# Patient Record
Sex: Female | Born: 1960
Health system: Southern US, Community
[De-identification: ages and names within clinical notes are randomized; demographics above are authoritative.]

## PROBLEM LIST (undated history)

## (undated) DIAGNOSIS — F419 Anxiety disorder, unspecified: Secondary | ICD-10-CM

## (undated) DIAGNOSIS — G4733 Obstructive sleep apnea (adult) (pediatric): Secondary | ICD-10-CM

## (undated) DIAGNOSIS — M797 Fibromyalgia: Secondary | ICD-10-CM

## (undated) DIAGNOSIS — F329 Major depressive disorder, single episode, unspecified: Secondary | ICD-10-CM

## (undated) DIAGNOSIS — F32A Depression, unspecified: Secondary | ICD-10-CM

## (undated) DIAGNOSIS — K219 Gastro-esophageal reflux disease without esophagitis: Secondary | ICD-10-CM

## (undated) DIAGNOSIS — M1990 Unspecified osteoarthritis, unspecified site: Secondary | ICD-10-CM

## (undated) DIAGNOSIS — N898 Other specified noninflammatory disorders of vagina: Secondary | ICD-10-CM

## (undated) DIAGNOSIS — G4700 Insomnia, unspecified: Secondary | ICD-10-CM

## (undated) HISTORY — PX: ABDOMINAL HYSTERECTOMY: SHX81

## (undated) HISTORY — DX: Insomnia, unspecified: G47.00

## (undated) HISTORY — PX: HEMORRHOID SURGERY: SHX153

## (undated) HISTORY — DX: Fibromyalgia: M79.7

## (undated) HISTORY — DX: Other specified noninflammatory disorders of vagina: N89.8

## (undated) HISTORY — DX: Obstructive sleep apnea (adult) (pediatric): G47.33

## (undated) HISTORY — PX: CHOLECYSTECTOMY: SHX55

---

## 2001-01-23 ENCOUNTER — Encounter: Payer: Self-pay | Admitting: Internal Medicine

## 2001-01-23 ENCOUNTER — Ambulatory Visit (HOSPITAL_COMMUNITY): Admission: RE | Admit: 2001-01-23 | Discharge: 2001-01-23 | Payer: Self-pay | Admitting: Internal Medicine

## 2001-08-23 ENCOUNTER — Encounter: Payer: Self-pay | Admitting: Obstetrics and Gynecology

## 2001-08-23 ENCOUNTER — Ambulatory Visit (HOSPITAL_COMMUNITY): Admission: RE | Admit: 2001-08-23 | Discharge: 2001-08-23 | Payer: Self-pay | Admitting: Obstetrics and Gynecology

## 2001-10-15 ENCOUNTER — Ambulatory Visit (HOSPITAL_COMMUNITY): Admission: RE | Admit: 2001-10-15 | Discharge: 2001-10-15 | Payer: Self-pay | Admitting: General Surgery

## 2002-08-29 ENCOUNTER — Ambulatory Visit (HOSPITAL_COMMUNITY): Admission: RE | Admit: 2002-08-29 | Discharge: 2002-08-29 | Payer: Self-pay | Admitting: Pulmonary Disease

## 2002-09-05 ENCOUNTER — Ambulatory Visit (HOSPITAL_COMMUNITY): Admission: RE | Admit: 2002-09-05 | Discharge: 2002-09-05 | Payer: Self-pay | Admitting: General Surgery

## 2003-10-05 ENCOUNTER — Ambulatory Visit (HOSPITAL_COMMUNITY): Admission: RE | Admit: 2003-10-05 | Discharge: 2003-10-05 | Payer: Self-pay | Admitting: Family Medicine

## 2004-10-28 ENCOUNTER — Ambulatory Visit (HOSPITAL_COMMUNITY): Admission: RE | Admit: 2004-10-28 | Discharge: 2004-10-28 | Payer: Self-pay | Admitting: Obstetrics and Gynecology

## 2004-11-18 ENCOUNTER — Ambulatory Visit (HOSPITAL_COMMUNITY): Admission: RE | Admit: 2004-11-18 | Discharge: 2004-11-18 | Payer: Self-pay | Admitting: Obstetrics & Gynecology

## 2004-11-28 ENCOUNTER — Ambulatory Visit (HOSPITAL_COMMUNITY): Admission: RE | Admit: 2004-11-28 | Discharge: 2004-11-28 | Payer: Self-pay | Admitting: Obstetrics & Gynecology

## 2005-10-29 ENCOUNTER — Ambulatory Visit (HOSPITAL_COMMUNITY): Admission: RE | Admit: 2005-10-29 | Discharge: 2005-10-29 | Payer: Self-pay | Admitting: Obstetrics and Gynecology

## 2006-11-09 ENCOUNTER — Ambulatory Visit (HOSPITAL_COMMUNITY): Admission: RE | Admit: 2006-11-09 | Discharge: 2006-11-09 | Payer: Self-pay | Admitting: Obstetrics and Gynecology

## 2008-01-18 ENCOUNTER — Ambulatory Visit (HOSPITAL_COMMUNITY): Admission: RE | Admit: 2008-01-18 | Discharge: 2008-01-18 | Payer: Self-pay | Admitting: Obstetrics and Gynecology

## 2009-01-23 ENCOUNTER — Ambulatory Visit (HOSPITAL_COMMUNITY): Admission: RE | Admit: 2009-01-23 | Discharge: 2009-01-23 | Payer: Self-pay | Admitting: Pulmonary Disease

## 2009-02-07 ENCOUNTER — Ambulatory Visit (HOSPITAL_COMMUNITY): Admission: RE | Admit: 2009-02-07 | Discharge: 2009-02-07 | Payer: Self-pay | Admitting: Obstetrics and Gynecology

## 2009-09-19 ENCOUNTER — Encounter (INDEPENDENT_AMBULATORY_CARE_PROVIDER_SITE_OTHER): Payer: Self-pay | Admitting: Pulmonary Disease

## 2009-09-19 ENCOUNTER — Ambulatory Visit: Payer: Self-pay | Admitting: Cardiovascular Disease

## 2009-09-19 ENCOUNTER — Ambulatory Visit (HOSPITAL_COMMUNITY): Admission: RE | Admit: 2009-09-19 | Discharge: 2009-09-19 | Payer: Self-pay | Admitting: Pulmonary Disease

## 2010-04-11 ENCOUNTER — Ambulatory Visit (HOSPITAL_COMMUNITY): Admission: RE | Admit: 2010-04-11 | Discharge: 2010-04-11 | Payer: Self-pay | Admitting: Obstetrics & Gynecology

## 2011-02-28 NOTE — H&P (Signed)
   NAME:  Monica Davis, Monica Davis                         ACCOUNT NO.:  [id number]   MEDICAL RECORD NO.:  [id number]                   PATIENT TYPE:   LOCATION:                                       FACILITY:   PHYSICIAN:  Dalia Heading, M.D.               DATE OF BIRTH:   DATE OF ADMISSION:  09/05/2002  DATE OF DISCHARGE:                                HISTORY & PHYSICAL   AGE:  50 years old.   CHIEF COMPLAINT:  Biliary colic secondary to cholelithiasis.   HISTORY OF PRESENT ILLNESS:  The patient is a 50 year old white female who  is referred for evaluation and treatment of biliary colic secondary to  cholelithiasis.  She has been having right upper quadrant abdominal pain,  nausea, bloating, and fatty food intolerance for a few months.  It has been  getting worse of late.  No fever, chills, or jaundice have been noted.   PAST MEDICAL HISTORY:  1. Depression.  2. Muscle strain.   PAST SURGICAL HISTORY:  1. Hysterectomy.  2. Hemorrhoidectomy.  3. Rectocele repair.  4. Bladder tack-up in the past.   CURRENT MEDICATIONS:  Baby aspirin, __________, Flexeril, Wellbutrin, B6 and  12 vitamin supplements.   ALLERGIES:  No known drug allergies.   REVIEW OF SYSTEMS:  The patient denies drinking or smoking.  She denies any  other cardiopulmonary difficulties or bleeding disorders.   PHYSICAL EXAMINATION:  GENERAL:  Well-developed, well-nourished white female  in no acute distress.  VITAL SIGNS:  Afebrile, and vital signs are stable.  HEENT:  No scleral icterus.  LUNGS:  Clear to auscultation with equal breath sounds bilaterally.  HEART:  Regular rate and rhythm.  Without S3, S4, or murmurs.  ABDOMEN:  Soft, with tenderness noted in the right upper quadrant to  palpation.  No hepatosplenomegaly, masses, hernias, or rigidity are noted.   LABORATORY DATA:  Ultrasound of the gallbladder reveals cholelithiasis with  a normal common bile duct.   IMPRESSION:  1. Biliary colic.  2.  Cholelithiasis.   PLAN:  The patient is scheduled for laparoscopic cholecystectomy on September 05, 2002.  The risks and benefits of the procedure including bleeding,  infection, hepatobiliary injury, and the possibility of an open procedure  were fully explained to the patient, who gave informed consent.                                               Dalia Heading, M.D.    MAJ/MEDQ  D:  08/30/2002  T:  08/30/2002  Job:  [id number]   cc:   Ramon Dredge L. Juanetta Gosling, M.D.  [address]  [place]  [place] [address]  Fax: [phone]

## 2011-02-28 NOTE — Op Note (Signed)
[facility]  Patient:    Monica Davis, Monica Davis Visit Number: [id number] MRN: [id number]          Service Type: DSU Location: DAY Attending Physician:  Dalia Heading Dictated by:   Franky Macho, M.D. Proc. Date: 10/15/01 Admit Date:  10/15/2001   CC:         Kari Baars, M.D.  Christin Bach, M.D.   Operative Report  AGE:  50 years old.  PREOPERATIVE DIAGNOSIS:  Hemorrhoidal disease.  POSTOPERATIVE DIAGNOSIS:  Hemorrhoidal disease.  PROCEDURE:  Internal and external hemorrhoidectomy.  SURGEON:  Franky Macho, M.D.  ANESTHESIA:  General.  INDICATIONS:  Patient is a 50 year old white female who presents with symptomatic hemorrhoidal disease.  The risks and benefits of the procedure including bleeding, infection and recurrence of the hemorrhoidal disease were fully explained to the patient, who gave informed consent.  DESCRIPTION OF PROCEDURE:  Patient was placed in lithotomy position after general anesthesia was administered.  The perineum was prepped and draped using the usual sterile technique with Betadine.  On examination, the patient had internal and external hemorrhoidal tissue at the 6 oclock position with an external hemorrhoidal skin tag anteriorly.  She is symptomatic from the posterior portion of the anus and thus, an internal and external hemorrhoidectomy was performed at the 6 oclock position.  A 2-0 Vicryl suture was placed at the apex of the internal hemorrhoid at the dentate line.  The hemorrhoidal tissue was then excised using Bovie electrocautery without difficulty.  The mucosal edges were reapproximated using a running 2-0 Vicryl suture.  Sensorcaine 0.5% was instilled into the surrounding perineum and the rectum was packed with Surgicel and viscous Xylocaine.  All tape and needle counts were correct at the end of the procedure.  The patient was awakened and transferred to PACU in stable  condition.  COMPLICATIONS:  None.  SPECIMEN:  Internal and external hemorrhoid.  BLOOD LOSS:  Minimal. Dictated by:   Franky Macho, M.D. Attending Physician:  Dalia Heading DD:  10/15/01 TD:  10/15/01 Job: [id number]

## 2011-02-28 NOTE — Op Note (Signed)
NAME:  Monica Davis, Monica Davis                         ACCOUNT NO.:  [id number]   MEDICAL RECORD NO.:  [id number]                  PATIENT TYPE:   LOCATION:                                       FACILITY:   PHYSICIAN:  Dalia Heading, M.D.               DATE OF BIRTH:   DATE OF PROCEDURE:  09/05/2002  DATE OF DISCHARGE:                                 OPERATIVE REPORT   PREOPERATIVE DIAGNOSIS:  Biliary colic, cholelithiasis.   POSTOPERATIVE DIAGNOSIS:  Biliary colic, cholelithiasis.   PROCEDURE:  Laparoscopic cholecystectomy   SURGEON:  Dalia Heading, M.D.   ANESTHESIA:  General endotracheal   INDICATIONS:  The patient is a 50 year old white female who is referred for  evaluation and treatment of biliary colic secondary to cholelithiasis.  The  risks and benefits of the procedure including bleeding, infection,  hepatobiliary injury, and the possibility of an open procedure were fully  explained to the patient, who gave informed consent.   DESCRIPTION OF PROCEDURE:  The patient was placed in the supine position.  After induction of general endotracheal anesthesia, the abdomen was prepped  and draped using the usual sterile technique with Betadine.  Surgical site  confirmation was performed.   A supraumbilical incision was made down to the fascia.  A Veress needle was  introduced into the abdominal cavity and confirmation of placement was done  using the saline drop test.  The abdomen was then insufflated to 16 mmHg  pressure.  An 11-mm trocar was introduced into the abdominal cavity under  direct visualization without difficulty.  The patient was placed in reverse  Trendelenburg position and an additional 11-mm trocar was placed in the  epigastric region and 5-mm trocars were placed in the right upper quadrant  and right flank regions. The liver was inspected and noted to be within  normal limits.   The gallbladder was retracted superiorly and laterally.  The dissection was  begun around the infundibulum of the gallbladder.  The cystic duct was first  identified.  Its junction to the infundibulum fully identified.  Endoclips  were placed proximally and distally on the cystic duct; and the cystic duct  was divided.  This was likewise done on the cystic artery.  The gallbladder  was then freed away from the gallbladder fossa using Bovie electrocautery.  The gallbladder was delivered through the epigastric trocar site using an  EndoCatch bag.  The gallbladder fossa was inspected and no abnormal bleeding  or bile leakage was noted.  Surgicel was placed in the gallbladder fossa,  the subhepatic space, as well as the right hepatic gutter irrigated with  normal saline.  All fluid and air were then evacuated from the abdominal  cavity prior to removal of the trocars.   All wounds were irrigated with normal saline.  All wounds were injected with  0.5% Sensorcaine.  The supraumbilical fascia was reapproximated using an #0  Vicryl  interrupted suture. All skin incisions were closed using staples.  Betadine ointment and dry sterile dressings were applied.   All tape and needle counts correct at the end of the procedure.  The patient  was extubated in the operating room and went back to recovery room in awake  and stable condition.   COMPLICATIONS:  None.   SPECIMENS:  Gallbladder with stones.   BLOOD LOSS:  Minimal.                                               Dalia Heading, M.D.    MAJ/MEDQ  D:  09/05/2002  T:  09/05/2002  Job:  [id number]   cc:   Ramon Dredge L. Juanetta Gosling, M.D.  [address]  [place]  [place] [address]  Fax: [phone]

## 2011-02-28 NOTE — H&P (Signed)
[facility]  Patient:    Monica Davis, FLOOR Visit Number: [id number] MRN: [id number]          Service Type: OUT Location: RAD Attending Physician:  Tilda Burrow Dictated by:   Franky Macho, M.D. Admit Date:  08/23/2001 Discharge Date: 08/23/2001   CC:         Kari Baars, M.D.             Christin Bach, M.D.                         History and Physical  DATE OF BIRTH:  Dec 06, 1960  CHIEF COMPLAINT:  Hemorrhoidal disease.  HISTORY OF PRESENT ILLNESS:  The patient is a 50 year old white female whose referred for evaluation and treatment of hemorrhoidal disease. She occasionally gets perianal irritation and spots of blood on the toilet paper when she wipes herself. She does have some problems with complete evacuation of her rectum after a bowel movement. She has had a rectocele repaired in the past. No abdominal pain, constipation, diarrhea, melena, or family history of colon carcinoma is noted.  PAST MEDICAL HISTORY:  Depression.  PAST SURGICAL HISTORY:  Total abdominal hysterectomy, rectocele repair, bladder tack in the past.  CURRENT MEDICATIONS:  Paxil, hormone pills, baby aspirin, vitamins.  ALLERGIES:  No known drug allergies.  REVIEW OF SYSTEMS:  The patient denies drinking or smoking. She denies any other cardiopulmonary difficulties or bleeding disorders.  PHYSICAL EXAMINATION:  GENERAL:  The patient is a well-developed, well-nourished, white female in no acute distress.  VITAL SIGNS:  She is afebrile and vital signs are stable.  LUNGS:  Clear to auscultation with equal breath sounds bilaterally.  HEART:  Reveals a regular rate and rhythm without S3, S4, or murmurs.  ABDOMEN:  Unremarkable.  RECTAL:  Reveals external hemorrhoidal skin tag noted posteriorly. No internal hemorrhoids are noted. Good sphincter tone is noted and the stool is Hemoccult negative.  IMPRESSION:  External hemorrhoidal disease.  PLAN:  The  patient is scheduled for an external hemorrhoidectomy on October 15, 2001. The risks and benefits of the procedure including bleeding, infection, and incontinence were fully explained to the patient, who gave informed consent. Dictated by:   Franky Macho, M.D. Attending Physician:  Tilda Burrow DD:  10/12/01 TD:  10/12/01 Job: [id number]

## 2011-03-04 ENCOUNTER — Other Ambulatory Visit: Payer: Self-pay | Admitting: Obstetrics & Gynecology

## 2011-03-04 DIAGNOSIS — Z139 Encounter for screening, unspecified: Secondary | ICD-10-CM

## 2011-04-21 ENCOUNTER — Ambulatory Visit (HOSPITAL_COMMUNITY)
Admission: RE | Admit: 2011-04-21 | Discharge: 2011-04-21 | Disposition: A | Discharge status: Home / Self Care | Payer: BC Managed Care – PPO | Source: Ambulatory Visit | Attending: Obstetrics & Gynecology | Admitting: Obstetrics & Gynecology

## 2011-04-21 DIAGNOSIS — Z139 Encounter for screening, unspecified: Secondary | ICD-10-CM

## 2011-04-21 DIAGNOSIS — Z1231 Encounter for screening mammogram for malignant neoplasm of breast: Secondary | ICD-10-CM | POA: Insufficient documentation

## 2011-04-24 ENCOUNTER — Other Ambulatory Visit: Payer: Self-pay | Admitting: Obstetrics & Gynecology

## 2011-04-24 DIAGNOSIS — R928 Other abnormal and inconclusive findings on diagnostic imaging of breast: Secondary | ICD-10-CM

## 2011-04-25 ENCOUNTER — Ambulatory Visit
Admission: RE | Admit: 2011-04-25 | Discharge: 2011-04-25 | Disposition: A | Discharge status: Home / Self Care | Payer: BC Managed Care – PPO | Source: Ambulatory Visit | Attending: Obstetrics & Gynecology | Admitting: Obstetrics & Gynecology

## 2011-04-25 DIAGNOSIS — R928 Other abnormal and inconclusive findings on diagnostic imaging of breast: Secondary | ICD-10-CM

## 2011-12-31 ENCOUNTER — Other Ambulatory Visit: Payer: Self-pay | Admitting: Adult Health

## 2011-12-31 DIAGNOSIS — N63 Unspecified lump in unspecified breast: Secondary | ICD-10-CM

## 2012-01-14 ENCOUNTER — Inpatient Hospital Stay (HOSPITAL_COMMUNITY)
Admission: RE | Admit: 2012-01-14 | Discharge: 2012-01-14 | Payer: BC Managed Care – PPO | Source: Ambulatory Visit | Attending: Adult Health | Admitting: Adult Health

## 2012-01-28 ENCOUNTER — Ambulatory Visit (HOSPITAL_COMMUNITY)
Admission: RE | Admit: 2012-01-28 | Discharge: 2012-01-28 | Disposition: A | Discharge status: Home / Self Care | Payer: BC Managed Care – PPO | Source: Ambulatory Visit | Attending: Adult Health | Admitting: Adult Health

## 2012-01-28 ENCOUNTER — Other Ambulatory Visit: Payer: Self-pay | Admitting: Adult Health

## 2012-01-28 DIAGNOSIS — N63 Unspecified lump in unspecified breast: Secondary | ICD-10-CM

## 2013-03-15 ENCOUNTER — Other Ambulatory Visit: Payer: Self-pay | Admitting: Adult Health

## 2013-04-05 ENCOUNTER — Telehealth: Payer: Self-pay | Admitting: Adult Health

## 2013-04-05 NOTE — Telephone Encounter (Signed)
Left message I called and to call back if needed

## 2013-04-05 NOTE — Telephone Encounter (Signed)
Pt called back she is at the beach til 04[place]18[place]13 and has irritation under panniculus that the mytex and diflucan has not helped as much a usual.Told her to try corn starch to see if that helps with moisture, if not call back.

## 2013-04-06 ENCOUNTER — Telehealth: Payer: Self-pay | Admitting: Adult Health

## 2013-04-06 NOTE — Telephone Encounter (Signed)
Left message to call back  

## 2013-05-02 ENCOUNTER — Other Ambulatory Visit: Payer: Self-pay | Admitting: Adult Health

## 2014-04-17 ENCOUNTER — Other Ambulatory Visit: Payer: Self-pay | Admitting: Adult Health

## 2014-04-17 DIAGNOSIS — Z1231 Encounter for screening mammogram for malignant neoplasm of breast: Secondary | ICD-10-CM

## 2014-04-26 ENCOUNTER — Ambulatory Visit (HOSPITAL_COMMUNITY)
Admission: RE | Admit: 2014-04-26 | Discharge: 2014-04-26 | Disposition: A | Discharge status: Home / Self Care | Payer: BC Managed Care – PPO | Source: Ambulatory Visit | Attending: Adult Health | Admitting: Adult Health

## 2014-04-26 DIAGNOSIS — Z1231 Encounter for screening mammogram for malignant neoplasm of breast: Secondary | ICD-10-CM | POA: Insufficient documentation

## 2014-05-01 ENCOUNTER — Other Ambulatory Visit: Payer: Self-pay | Admitting: Adult Health

## 2014-05-22 ENCOUNTER — Ambulatory Visit (INDEPENDENT_AMBULATORY_CARE_PROVIDER_SITE_OTHER): Payer: BC Managed Care – PPO | Admitting: Adult Health

## 2014-05-22 ENCOUNTER — Encounter: Payer: Self-pay | Admitting: Adult Health

## 2014-05-22 VITALS — BP 120/60 | HR 76 | Ht 64.0 in | Wt 227.5 lb

## 2014-05-22 DIAGNOSIS — Z01419 Encounter for gynecological examination (general) (routine) without abnormal findings: Secondary | ICD-10-CM

## 2014-05-22 DIAGNOSIS — N898 Other specified noninflammatory disorders of vagina: Secondary | ICD-10-CM

## 2014-05-22 DIAGNOSIS — M2550 Pain in unspecified joint: Secondary | ICD-10-CM

## 2014-05-22 DIAGNOSIS — Z1212 Encounter for screening for malignant neoplasm of rectum: Secondary | ICD-10-CM

## 2014-05-22 HISTORY — DX: Other specified noninflammatory disorders of vagina: N89.8

## 2014-05-22 LAB — HEMOCCULT GUIAC POC 1CARD (OFFICE): Fecal Occult Blood, POC: NEGATIVE

## 2014-05-22 MED ORDER — NYSTATIN-TRIAMCINOLONE 100000-0.1 UNIT/GM-% EX CREA: TOPICAL_CREAM | CUTANEOUS | Status: DC

## 2014-05-22 NOTE — Progress Notes (Signed)
Patient ID: Monica Davis, female   DOB: [birth year], 53 y.o.   MRN: [id number] History of Present Illness: Monica Davis is a 53 year old white female, married, in for a physical, she is sp hysterectomy.She has some vaginal dryness and pain in her joints, she has fibromyalgia.Has place at beach and is expecting 3rd grand child.   Current Medications, Allergies, Past Medical History, Past Surgical History, Family History and Social History were reviewed in [employer] record.     Review of Systems: Patient denies any headaches, blurred vision, shortness of breath, chest pain, abdominal pain, problems with bowel movements, urination, or intercourse. No mood swings, see positives in HPI.    Physical Exam:BP 120/60  Pulse 76  Ht 5\' 4"  (1.626 m)  Wt 227 lb 8 oz (103.193 kg)  BMI 39.03 kg/m2 General:  Well developed, well nourished, no acute distress Skin:  Warm and dry Neck:  Midline trachea, normal thyroid Lungs; Clear to auscultation bilaterally Breast:  No dominant palpable mass, retraction, or nipple discharge Cardiovascular: Regular rate and rhythm Abdomen:  Soft, non tender, no hepatosplenomegaly Pelvic:  External genitalia is normal in appearance.  The vagina is pale with loss of moisture and rugae.The cervix and uterus are absent.  No  adnexal masses or tenderness noted. Rectal: Good sphincter tone, no polyps, or hemorrhoids felt.  Hemoccult negative. Extremities:  No swelling or varicosities noted, has some changes in DIP and PIP joints Psych:  No mood changes, alert and cooperative,seems happy   Impression: Yearly exam no pap Vaginal dryness Joint pain    Plan: Physical in 1 year Mammogram yearly Colonoscopy advised Check CBC,CMP,TSH and lipids and ANA,RF Refilled mycolog cream Try Samul Dada

## 2014-05-22 NOTE — Patient Instructions (Signed)
Physical in 1 year Mammogram yearly  Colonoscopy advised  

## 2014-05-23 ENCOUNTER — Telehealth: Payer: Self-pay | Admitting: Adult Health

## 2014-05-23 LAB — CBC
HCT: 43.9 % (ref 36.0–46.0)
Hemoglobin: 14.7 g/dL (ref 12.0–15.0)
MCH: 29.9 pg (ref 26.0–34.0)
MCHC: 33.5 g/dL (ref 30.0–36.0)
MCV: 89.2 fL (ref 78.0–100.0)
Platelets: 259 10*3/uL (ref 150–400)
RBC: 4.92 MIL/uL (ref 3.87–5.11)
RDW: 13.9 % (ref 11.5–15.5)
WBC: 7.4 10*3/uL (ref 4.0–10.5)

## 2014-05-23 LAB — LIPID PANEL
Cholesterol: 262 mg/dL — ABNORMAL HIGH (ref 0–200)
HDL: 55 mg/dL (ref >39)
LDL Cholesterol: 164 mg/dL — ABNORMAL HIGH (ref 0–99)
Total CHOL/HDL Ratio: 4.8 Ratio
Triglycerides: 216 mg/dL — ABNORMAL HIGH (ref <150)
VLDL: 43 mg/dL — ABNORMAL HIGH (ref 0–40)

## 2014-05-23 LAB — COMPREHENSIVE METABOLIC PANEL
ALT: 21 U/L (ref 0–35)
AST: 18 U/L (ref 0–37)
Albumin: 4.6 g/dL (ref 3.5–5.2)
Alkaline Phosphatase: 60 U/L (ref 39–117)
BUN: 14 mg/dL (ref 6–23)
CO2: 29 mEq/L (ref 19–32)
Calcium: 9.9 mg/dL (ref 8.4–10.5)
Chloride: 102 mEq/L (ref 96–112)
Creat: 0.64 mg/dL (ref 0.50–1.10)
Glucose, Bld: 89 mg/dL (ref 70–99)
Potassium: 4.6 mEq/L (ref 3.5–5.3)
Sodium: 141 mEq/L (ref 135–145)
Total Bilirubin: 0.5 mg/dL (ref 0.2–1.2)
Total Protein: 6.8 g/dL (ref 6.0–8.3)

## 2014-05-23 LAB — ANA: Anti Nuclear Antibody(ANA): NEGATIVE

## 2014-05-23 LAB — RHEUMATOID FACTOR: Rhuematoid fact SerPl-aCnc: <10 IU/mL (ref <=14)

## 2014-05-23 LAB — TSH: TSH: 1.43 u[IU]/mL (ref 0.350–4.500)

## 2014-05-23 NOTE — Telephone Encounter (Signed)
Pt aware of labs, will mail her a copy

## 2014-05-31 ENCOUNTER — Telehealth: Payer: Self-pay | Admitting: Adult Health

## 2014-05-31 NOTE — Telephone Encounter (Signed)
Wants be to call about daughter

## 2014-08-14 ENCOUNTER — Encounter: Payer: Self-pay | Admitting: Adult Health

## 2014-10-04 ENCOUNTER — Telehealth: Payer: Self-pay | Admitting: *Deleted

## 2014-10-04 NOTE — Telephone Encounter (Signed)
Monica Davis has some varicose veins and one on the side of her calf burst Saturday after she scratched it and then bled again yesterday, does not look infected or red, no pain, appt offered she declined, try covering with light pressure dressing and make appt with vein center, for evaluation may need laser at some point.Her PCP is out of the office.

## 2014-11-03 ENCOUNTER — Ambulatory Visit (HOSPITAL_COMMUNITY): Payer: BC Managed Care – PPO | Admitting: Physical Therapy

## 2014-11-15 ENCOUNTER — Ambulatory Visit (HOSPITAL_COMMUNITY)
Admission: RE | Admit: 2014-11-15 | Discharge: 2014-11-15 | Disposition: A | Discharge status: Home / Self Care | Payer: BC Managed Care – PPO | Source: Ambulatory Visit | Attending: Internal Medicine | Admitting: Internal Medicine

## 2014-11-15 DIAGNOSIS — M545 Low back pain, unspecified: Secondary | ICD-10-CM

## 2014-11-15 DIAGNOSIS — M25659 Stiffness of unspecified hip, not elsewhere classified: Secondary | ICD-10-CM | POA: Insufficient documentation

## 2014-11-15 DIAGNOSIS — R262 Difficulty in walking, not elsewhere classified: Secondary | ICD-10-CM | POA: Diagnosis present

## 2014-11-15 DIAGNOSIS — M546 Pain in thoracic spine: Secondary | ICD-10-CM | POA: Diagnosis not present

## 2014-11-15 DIAGNOSIS — M25519 Pain in unspecified shoulder: Secondary | ICD-10-CM

## 2014-11-15 DIAGNOSIS — M2560 Stiffness of unspecified joint, not elsewhere classified: Secondary | ICD-10-CM

## 2014-11-15 DIAGNOSIS — M25562 Pain in left knee: Secondary | ICD-10-CM | POA: Insufficient documentation

## 2014-11-15 DIAGNOSIS — M25619 Stiffness of unspecified shoulder, not elsewhere classified: Secondary | ICD-10-CM | POA: Insufficient documentation

## 2014-11-15 NOTE — Therapy (Signed)
[place], [place], [address] Phone: [phone]   Fax:  [phone]  Physical Therapy Treatment  Patient Details  Name: Monica Davis MRN: [id number] Date of Birth: [birth year] Referring Provider:  Delphina Cahill, MD  Encounter Date: 11/15/2014      PT End of Session - 11/15/14 1644    Visit Number 1   Number of Visits 16   Date for PT Re-Evaluation 12/15/14   Authorization Type [employer]   Authorization - Visit Number 1   Authorization - Number of Visits 16   PT Start Time 6962   PT Stop Time 1611   PT Time Calculation (min) 50 min   Activity Tolerance Patient tolerated treatment well   Behavior During Therapy [facility] for tasks assessed/performed      Past Medical History  Diagnosis Date  . Fibromyalgia   . Vaginal dryness 05/22/2014    Past Surgical History  Procedure Laterality Date  . Abdominal hysterectomy    . Cholecystectomy    . Hemorrhoid surgery      There were no vitals taken for this visit.  Visit Diagnosis:  Difficulty walking  Stiffness of hip joint, unspecified laterality  Joint stiffness of spine  Shoulder stiffness, unspecified laterality  Knee pain, acute, left  Low back pain at multiple sites  Pain in joint, shoulder region, unspecified laterality      Subjective Assessment - 11/15/14 1528    Symptoms hip, back and shoulder pain.    Pertinent History Patient has a long history of pain secondary to fibromyalgia, and pain that displaysed minimalimprovement with medication. Diagnosed with fibromyalgia since 2000. Difficuly walking secondary to pain. patient is wearing compression stockings that she beleievs have helped cut down the swelling in her legs s/p vein blowing out.    Limitations Sitting;Standing   How long can you sit comfortably? <10 minutes   How long can you walk comfortably? < 1 hour before back pain.    Patient Stated Goals decrease pain, be able to walk for 1 hour  comfortably, patient wants to be able to mow the lawn.    Currently in Pain? Yes   Pain Score 5    Pain Location Back  Back, and knees most today.   Pain Orientation Posterior   Pain Descriptors / Indicators Tender;Aching;Tingling  tingling in arms   Pain Type Chronic pain   Pain Onset More than a month ago   Aggravating Factors  not chanign positions, and walking for prolonged periiods of time.    Pain Relieving Factors changing positions. flexiril and hydrocodone.           [facility] PT Assessment - 11/15/14 0001    Assessment   Medical Diagnosis  shoulder pain hip pain, knee pain, low back pain, stiffness in thoracic spine, hip stiffness, shoulder stiffness.  dificulty walking.    Onset Date 10/13/14   Next MD Visit Delphina Cahill, 12/09/14   Prior Therapy no   Balance Screen   Has the patient fallen in the past 6 months Yes   How many times? multiple times   Has the patient had a decrease in activity level because of a fear of falling?  No   Is the patient reluctant to leave their home because of a fear of falling?  No   Prior Function   Level of Independence Independent with basic ADLs   Vocation Full time employment   Observation/Other Assessments   Focus on Therapeutic Outcomes (FOTO)  44% limited   Other:   Other/ Comments Gait: limited weight shift to right, limited calcaneal version, limited hip internal rotation,  limited hip sway,    Other:   Other/Comments 3D hip excursion: limited squatability secondary to knee pain.    AROM   Right Shoulder Flexion 145 Degrees   Right Shoulder Horizontal ABduction 0 Degrees   Left Shoulder Flexion 145 Degrees   Left Shoulder Horizontal ABduction 0 Degrees   Right Hip Internal Rotation  30   Left Hip Internal Rotation  19   Right Ankle Dorsiflexion 20   Left Ankle Dorsiflexion 13   Cervical Flexion WFL   Cervical Extension WFL   Cervical - Right Rotation WFL   Cervical - Left Rotation WFRL   Lumbar Flexion WFL   Lumbar Extension  WFL   Lumbar - Right Rotation 28   Lumbar - Left Rotation 44   Strength   Right Hip Extension 3/5   Right Hip ABduction 3/5   Left Hip Extension 3/5   Left Hip ABduction 3/5   Flexibility   Soft Tissue Assessment /Muscle Lenght yes   Hamstrings WNL   Quadriceps Positive 120, 110 on Lt.  bilaterally   Piriformis limited bilateral, more limited on left           [facility] Adult PT Treatment/Exercise - 11/15/14 0001    Posture/Postural Control   Posture/Postural Control Postural limitations   Posture Comments forward rounded shoudlers, excessive kyphosis   Lumbar Exercises: Seated   Other Seated Lumbar Exercises 3D thoracic spine excursions 10x with overhead reaches   Knee/Hip Exercises: Standing   Other Standing Knee Exercises 3D hip excursions 10x, no sagittal plane due to pain          PT Education - 11/15/14 1631    Education provided Yes   Education Details Diagnosis and prognosis of fibromyalgia, treatment goals/strategies, and hEP: 3Dhip excursions, 3D thoracic spine excursions.    Person(s) Educated Patient   Methods Explanation;Demonstration;Handout   Comprehension Verbalized understanding;Returned demonstration          PT Short Term Goals - 11/15/14 1635    PT SHORT TERM GOAL #1   Title Patient will dmeosntrate increased bilateral flexion to 155 degrees and horizontal abduction to 15 degrees to more easily reach over head and behind herself.   Baseline Flexion: 145 bilateral, Abduction 0 ilateral   Time 4   Period Weeks   Status New   PT SHORT TERM GOAL #2   Title Patient will demosntrate increased hip internal rotation of 30 degrees bilaterally to improve deceleration mechanics and stride length during gait   Baseline RT: 30, Lt 19   Time 4   Period Weeks   Status New   PT SHORT TERM GOAL #3   Title Patient will demosntrate increased thoracic spine rotation bilaterally to 45 degrees to imrpove ability to look over shoulder while driving   Baseline 28  degrees to Rt, 45 degrees to Lt    Time 4   Period Weeks   Status New   PT SHORT TERM GOAL #4   Title Patient will displays independence with HEP   Time 4   Period Weeks   Status New           PT Long Term Goals - 11/15/14 1638    PT LONG TERM GOAL #1   Title Patient will demosntrate increased bilateral flexion to 165 degrees and horizontal abduction to 25 degrees to more easily reach over head and  behind herself.   Time 8   Period Weeks   Status New   PT LONG TERM GOAL #2   Title Patient will demosntrate increased hip internal rotation of 38 degrees bilaterally to improve deceleration mechanics and stride length during gait   Time 8   Period Weeks   Status New   PT LONG TERM GOAL #3   Title Patient will demosntrate increased thoracic spine rotation bilaterally to 60 degrees to imrpove ability to look over shoulder while driving   Time 8   Period Weeks   Status New   PT LONG TERM GOAL #4   Title Patient will dmoenstrate increased glit max and med strength of 4/5 to improve hip stability during gait and decrease excessive knee loading durign sit to and from standing.    Time 8   Period Weeks   Status New   PT LONG TERM GOAL #5   Title Patient will be able to walk and stand >1 hour with pain <5/10 to perform ADLs and IADLs   Time 8   Period Weeks   Status New           Plan - 11/15/14 1644    Clinical Impression Statement Patient displays stiffness throughtou her hips, thoracic spine, shoulder and knee resulting in difficulty walking secondary to a history of pain related to fibromyalgia. Patient will benefit from skilled physical therapy to improve knee, hip, trunk and shoulder flexibility to improve gait mechanicns, posture and strength so patient can better tolerate full work days, working in her garden , and cleaning her home wiith improved body mechanics and decreased pain    Pt will benefit from skilled therapeutic intervention in order to improve on the following  deficits Abnormal gait;Decreased endurance;Increased muscle spasms;Improper body mechanics;Impaired flexibility;Decreased strength;Difficulty walking;Decreased mobility;Pain;Decreased range of motion;Increased fascial restricitons;Decreased balance   Rehab Potential Good   PT Frequency 2x / week   PT Duration 8 weeks   PT Treatment/Interventions Gait training;Stair training;Patient/family education;Passive range of motion;Functional mobility training;Therapeutic activities;Manual techniques;Therapeutic exercise;Balance training   PT Next Visit Plan Introduction of LE stretches: 3 way hip flexor, 3 way piriformis, 2 way groin, and 3 way hamstring;  way pectoral, latissimus, posterior shoulder and anterior shoulder stretches. begin sumo walk per pain tolerance   PT Home Exercise Plan 3D thoracic spine excursions, 3 D hip excursions no sagittal plane.    Consulted and Agree with Plan of Care Patient      Problem List Patient Active Problem List   Diagnosis Date Noted  . Vaginal dryness 05/22/2014    Devona Konig PT DPT [place], [place], [address] Phone: [phone]   Fax:  [phone]

## 2014-11-20 ENCOUNTER — Ambulatory Visit (HOSPITAL_COMMUNITY): Payer: BC Managed Care – PPO | Admitting: Physical Therapy

## 2014-11-23 ENCOUNTER — Encounter (HOSPITAL_COMMUNITY): Payer: BC Managed Care – PPO | Admitting: Physical Therapy

## 2014-11-28 ENCOUNTER — Encounter (HOSPITAL_COMMUNITY): Payer: BC Managed Care – PPO

## 2014-11-28 NOTE — Addendum Note (Signed)
Encounter addended by: Leia Alf, PT on: 11/28/2014 10:07 AM<BR>     Documentation filed: Orders

## 2014-11-30 ENCOUNTER — Encounter (HOSPITAL_COMMUNITY): Payer: BC Managed Care – PPO | Admitting: Physical Therapy

## 2014-12-04 ENCOUNTER — Encounter (HOSPITAL_COMMUNITY): Payer: BC Managed Care – PPO | Admitting: Physical Therapy

## 2014-12-11 ENCOUNTER — Encounter (HOSPITAL_COMMUNITY): Payer: BC Managed Care – PPO | Admitting: Physical Therapy

## 2014-12-18 ENCOUNTER — Encounter (HOSPITAL_COMMUNITY): Payer: BC Managed Care – PPO | Admitting: Physical Therapy

## 2014-12-21 ENCOUNTER — Encounter (HOSPITAL_COMMUNITY): Payer: Self-pay | Admitting: Physical Therapy

## 2014-12-21 NOTE — Therapy (Signed)
[place], [place], [address] Phone: [phone]   Fax:  [phone]  Patient Details  Name: Monica Davis MRN: [id number] Date of Birth: [birth year] Referring Provider:  No ref. provider found  Encounter Date: 12/21/2014  PHYSICAL THERAPY DISCHARGE SUMMARY  Visits from Start of Care: 1  Current functional level related to goals / functional outcomes: PT Short Term Goals - 11/15/14 1635    PT SHORT TERM GOAL #1   Title Patient will dmeosntrate increased bilateral flexion to 155 degrees and horizontal abduction to 15 degrees to more easily reach over head and behind herself.   Baseline Flexion: 145 bilateral, Abduction 0 ilateral   Time 4   Period Weeks   Status New   PT SHORT TERM GOAL #2   Title Patient will demosntrate increased hip internal rotation of 30 degrees bilaterally to improve deceleration mechanics and stride length during gait   Baseline RT: 30, Lt 19   Time 4   Period Weeks   Status New   PT SHORT TERM GOAL #3   Title Patient will demosntrate increased thoracic spine rotation bilaterally to 45 degrees to imrpove ability to look over shoulder while driving   Baseline 28 degrees to Rt, 45 degrees to Lt    Time 4   Period Weeks   Status New   PT SHORT TERM GOAL #4   Title Patient will displays independence with HEP   Time 4   Period Weeks   Status New           PT Long Term Goals - 11/15/14 1638    PT LONG TERM GOAL #1   Title Patient will demosntrate increased bilateral flexion to 165 degrees and horizontal abduction to 25 degrees to more easily reach over head and behind herself.   Time 8   Period Weeks   Status New   PT LONG TERM GOAL #2   Title Patient will demosntrate increased hip internal rotation of 38 degrees bilaterally to improve deceleration mechanics and stride length during gait    Time 8   Period Weeks   Status New   PT LONG TERM GOAL #3   Title Patient will demosntrate increased thoracic spine rotation bilaterally to 60 degrees to imrpove ability to look over shoulder while driving   Time 8   Period Weeks   Status New   PT LONG TERM GOAL #4   Title Patient will dmoenstrate increased glit max and med strength of 4/5 to improve hip stability during gait and decrease excessive knee loading durign sit to and from standing.    Time 8   Period Weeks   Status New   PT LONG TERM GOAL #5   Title Patient will be able to walk and stand >1 hour with pain <5/10 to perform ADLs and IADLs   Time 8   Period Weeks   Status New          Plan: Patient agrees to discharge.  Patient goals were not met. Patient is being discharged due to financial reasons.  ?????       Devona Konig PT DPT [place] [address] [place], [place], [address] Phone: [phone]   Fax:  [phone]

## 2014-12-25 ENCOUNTER — Encounter (HOSPITAL_COMMUNITY): Payer: BC Managed Care – PPO | Admitting: Physical Therapy

## 2015-01-01 ENCOUNTER — Encounter (HOSPITAL_COMMUNITY): Payer: BC Managed Care – PPO | Admitting: Physical Therapy

## 2015-01-08 ENCOUNTER — Encounter (HOSPITAL_COMMUNITY): Payer: BC Managed Care – PPO | Admitting: Physical Therapy

## 2015-01-15 ENCOUNTER — Encounter (HOSPITAL_COMMUNITY): Payer: BC Managed Care – PPO | Admitting: Physical Therapy

## 2015-01-23 ENCOUNTER — Encounter (INDEPENDENT_AMBULATORY_CARE_PROVIDER_SITE_OTHER): Payer: Self-pay | Admitting: *Deleted

## 2015-02-01 ENCOUNTER — Other Ambulatory Visit (INDEPENDENT_AMBULATORY_CARE_PROVIDER_SITE_OTHER): Payer: Self-pay | Admitting: *Deleted

## 2015-02-01 ENCOUNTER — Encounter (INDEPENDENT_AMBULATORY_CARE_PROVIDER_SITE_OTHER): Payer: Self-pay | Admitting: *Deleted

## 2015-02-01 DIAGNOSIS — Z1211 Encounter for screening for malignant neoplasm of colon: Secondary | ICD-10-CM

## 2015-03-15 ENCOUNTER — Telehealth (INDEPENDENT_AMBULATORY_CARE_PROVIDER_SITE_OTHER): Payer: Self-pay | Admitting: *Deleted

## 2015-03-15 NOTE — Telephone Encounter (Signed)
Patient needs trilyte 

## 2015-03-16 MED ORDER — PEG 3350-KCL-NA BICARB-NACL 420 G PO SOLR: 4000.0000 mL | Freq: Once | ORAL | Status: DC

## 2015-03-20 ENCOUNTER — Other Ambulatory Visit: Payer: Self-pay | Admitting: Obstetrics and Gynecology

## 2015-03-20 DIAGNOSIS — Z1231 Encounter for screening mammogram for malignant neoplasm of breast: Secondary | ICD-10-CM

## 2015-04-05 ENCOUNTER — Telehealth (INDEPENDENT_AMBULATORY_CARE_PROVIDER_SITE_OTHER): Payer: Self-pay | Admitting: *Deleted

## 2015-04-05 NOTE — Telephone Encounter (Signed)
Referring MD/PCP: hall   Procedure: tcs  Reason/Indication:  screening  Has patient had this procedure before?  no  If so, when, by whom and where?    Is there a family history of colon cancer?  no  Who?  What age when diagnosed?    Is patient diabetic?   no      Does patient have prosthetic heart valve?  no  Do you have a pacemaker?  no  Has patient ever had endocarditis? no  Has patient had joint replacement within last 12 months?  no  Does patient tend to be constipated or take laxatives? sometime  Is patient on Coumadin, Plavix and/or Aspirin? yes  Medications: asa 81 mg daily, cymbalta 60 mg daily, centrum silver daily, omeprazole otc daily, stool softener prn  Allergies: nkda  Medication Adjustment: asa 2 days  Procedure date & time: 05/03/15 at 830

## 2015-04-05 NOTE — Telephone Encounter (Signed)
agree

## 2015-04-30 ENCOUNTER — Ambulatory Visit (HOSPITAL_COMMUNITY)
Admission: RE | Admit: 2015-04-30 | Discharge: 2015-04-30 | Disposition: A | Discharge status: Home / Self Care | Payer: BC Managed Care – PPO | Source: Ambulatory Visit | Attending: Obstetrics and Gynecology | Admitting: Obstetrics and Gynecology

## 2015-04-30 DIAGNOSIS — Z1231 Encounter for screening mammogram for malignant neoplasm of breast: Secondary | ICD-10-CM | POA: Diagnosis present

## 2015-05-03 ENCOUNTER — Ambulatory Visit (HOSPITAL_COMMUNITY)
Admission: RE | Admit: 2015-05-03 | Discharge: 2015-05-03 | Disposition: A | Discharge status: Home / Self Care | Payer: BC Managed Care – PPO | Source: Ambulatory Visit | Attending: Internal Medicine | Admitting: Internal Medicine

## 2015-05-03 ENCOUNTER — Encounter (HOSPITAL_COMMUNITY)
Admission: RE | Disposition: A | Discharge status: Home / Self Care | Payer: Self-pay | Source: Ambulatory Visit | Attending: Internal Medicine

## 2015-05-03 ENCOUNTER — Encounter (HOSPITAL_COMMUNITY): Payer: Self-pay | Admitting: *Deleted

## 2015-05-03 DIAGNOSIS — K219 Gastro-esophageal reflux disease without esophagitis: Secondary | ICD-10-CM | POA: Insufficient documentation

## 2015-05-03 DIAGNOSIS — Z9049 Acquired absence of other specified parts of digestive tract: Secondary | ICD-10-CM | POA: Diagnosis not present

## 2015-05-03 DIAGNOSIS — K644 Residual hemorrhoidal skin tags: Secondary | ICD-10-CM | POA: Insufficient documentation

## 2015-05-03 DIAGNOSIS — Z7982 Long term (current) use of aspirin: Secondary | ICD-10-CM | POA: Diagnosis not present

## 2015-05-03 DIAGNOSIS — M797 Fibromyalgia: Secondary | ICD-10-CM | POA: Insufficient documentation

## 2015-05-03 DIAGNOSIS — F329 Major depressive disorder, single episode, unspecified: Secondary | ICD-10-CM | POA: Insufficient documentation

## 2015-05-03 DIAGNOSIS — Z1211 Encounter for screening for malignant neoplasm of colon: Secondary | ICD-10-CM | POA: Insufficient documentation

## 2015-05-03 DIAGNOSIS — F419 Anxiety disorder, unspecified: Secondary | ICD-10-CM | POA: Diagnosis not present

## 2015-05-03 DIAGNOSIS — K6389 Other specified diseases of intestine: Secondary | ICD-10-CM | POA: Diagnosis not present

## 2015-05-03 DIAGNOSIS — M1990 Unspecified osteoarthritis, unspecified site: Secondary | ICD-10-CM | POA: Insufficient documentation

## 2015-05-03 DIAGNOSIS — K648 Other hemorrhoids: Secondary | ICD-10-CM | POA: Diagnosis not present

## 2015-05-03 DIAGNOSIS — K6289 Other specified diseases of anus and rectum: Secondary | ICD-10-CM | POA: Diagnosis not present

## 2015-05-03 DIAGNOSIS — Z79899 Other long term (current) drug therapy: Secondary | ICD-10-CM | POA: Insufficient documentation

## 2015-05-03 DIAGNOSIS — K5730 Diverticulosis of large intestine without perforation or abscess without bleeding: Secondary | ICD-10-CM | POA: Diagnosis not present

## 2015-05-03 HISTORY — PX: COLONOSCOPY: SHX5424

## 2015-05-03 HISTORY — DX: Major depressive disorder, single episode, unspecified: F32.9

## 2015-05-03 HISTORY — DX: Unspecified osteoarthritis, unspecified site: M19.90

## 2015-05-03 HISTORY — DX: Depression, unspecified: F32.A

## 2015-05-03 HISTORY — DX: Anxiety disorder, unspecified: F41.9

## 2015-05-03 HISTORY — DX: Gastro-esophageal reflux disease without esophagitis: K21.9

## 2015-05-03 SURGERY — COLONOSCOPY
Anesthesia: Moderate Sedation

## 2015-05-03 MED ORDER — MEPERIDINE HCL 50 MG/ML IJ SOLN
INTRAMUSCULAR | Status: AC
  Filled 2015-05-03: qty 1

## 2015-05-03 MED ORDER — STERILE WATER FOR IRRIGATION IR SOLN
Status: DC | PRN
  Administered 2015-05-03: 08:00:00

## 2015-05-03 MED ORDER — SODIUM CHLORIDE 0.9 % IV SOLN
INTRAVENOUS | Status: DC
  Administered 2015-05-03: 1000 mL via INTRAVENOUS
  Administered 2015-05-03: 09:00:00 via INTRAVENOUS

## 2015-05-03 MED ORDER — MIDAZOLAM HCL 5 MG/5ML IJ SOLN
INTRAMUSCULAR | Status: AC
  Filled 2015-05-03: qty 10

## 2015-05-03 MED ORDER — MEPERIDINE HCL 50 MG/ML IJ SOLN
INTRAMUSCULAR | Status: DC | PRN
  Administered 2015-05-03: 25 mg
  Administered 2015-05-03: 50 mg
  Administered 2015-05-03: 25 mg

## 2015-05-03 MED ORDER — MIDAZOLAM HCL 5 MG/5ML IJ SOLN
INTRAMUSCULAR | Status: DC | PRN
  Administered 2015-05-03 (×4): 2 mg via INTRAVENOUS

## 2015-05-03 NOTE — Discharge Instructions (Signed)
Resume usual medications and high fiber diet. °No driving for 24 hours. °Next screening exam in 10 years. ° °Colonoscopy, Care After °These instructions give you information on caring for yourself after your procedure. Your doctor may also give you more specific instructions. Call your doctor if you have any problems or questions after your procedure. °HOME CARE °· Do not drive for 24 hours. °· Do not sign important papers or use machinery for 24 hours. °· You may shower. °· You may go back to your usual activities, but go slower for the first 24 hours. °· Take rest breaks often during the first 24 hours. °· Walk around or use warm packs on your belly (abdomen) if you have belly cramping or gas. °· Drink enough fluids to keep your pee (urine) clear or pale yellow. °· Resume your normal diet. Avoid heavy or fried foods. °· Avoid drinking alcohol for 24 hours or as told by your doctor. °· Only take medicines as told by your doctor. °If a tissue sample (biopsy) was taken during the procedure:  °· Do not take aspirin or blood thinners for 7 days, or as told by your doctor. °· Do not drink alcohol for 7 days, or as told by your doctor. °· Eat soft foods for the first 24 hours. °GET HELP IF: °You still have a small amount of blood in your poop (stool) 2-3 days after the procedure. °GET HELP RIGHT AWAY IF: °· You have more than a small amount of blood in your poop. °· You see clumps of tissue (blood clots) in your poop. °· Your belly is puffy (swollen). °· You feel sick to your stomach (nauseous) or throw up (vomit). °· You have a fever. °· You have belly pain that gets worse and medicine does not help. °MAKE SURE YOU: °· Understand these instructions. °· Will watch your condition. °· Will get help right away if you are not doing well or get worse. °Document Released: 11/01/2010 Document Revised: 10/04/2013 Document Reviewed: 06/06/2013 °ExitCare® Patient Information ©2015 [employer]. This information is not intended to  replace advice given to you by your health care provider. Make sure you discuss any questions you have with your health care provider. °High-Fiber Diet °Fiber is found in fruits, vegetables, and grains. A high-fiber diet encourages the addition of more whole grains, legumes, fruits, and vegetables in your diet. The recommended amount of fiber for adult males is 38 g per day. For adult females, it is 25 g per day. Pregnant and lactating women should get 28 g of fiber per day. If you have a digestive or bowel problem, ask your caregiver for advice before adding high-fiber foods to your diet. Eat a variety of high-fiber foods instead of only a select few type of foods.  °PURPOSE °· To increase stool bulk. °· To make bowel movements more regular to prevent constipation. °· To lower cholesterol. °· To prevent overeating. °WHEN IS THIS DIET USED? °· It may be used if you have constipation and hemorrhoids. °· It may be used if you have uncomplicated diverticulosis (intestine condition) and irritable bowel syndrome. °· It may be used if you need help with weight management. °· It may be used if you want to add it to your diet as a protective measure against atherosclerosis, diabetes, and cancer. °SOURCES OF FIBER °· Whole-grain breads and cereals. °· Fruits, such as apples, oranges, bananas, berries, prunes, and pears. °· Vegetables, such as green peas, carrots, sweet potatoes, beets, broccoli, cabbage, spinach, and   artichokes. °· Legumes, such split peas, soy, lentils. °· Almonds. °FIBER CONTENT IN FOODS °Starches and Grains / Dietary Fiber (g) °· Cheerios, 1 cup / 3 g °· Corn Flakes cereal, 1 cup / 0.7 g °· Rice crispy treat cereal, 1¼ cup / 0.3 g °· Instant oatmeal (cooked), ½ cup / 2 g °· Frosted wheat cereal, 1 cup / 5.1 g °· Brown, long-grain rice (cooked), 1 cup / 3.5 g °· White, long-grain rice (cooked), 1 cup / 0.6 g °· Enriched macaroni (cooked), 1 cup / 2.5 g °Legumes / Dietary Fiber (g) °· Baked beans (canned,  plain, or vegetarian), ½ cup / 5.2 g °· Kidney beans (canned), ½ cup / 6.8 g °· Pinto beans (cooked), ½ cup / 5.5 g °Breads and Crackers / Dietary Fiber (g) °· Plain or honey graham crackers, 2 squares / 0.7 g °· Saltine crackers, 3 squares / 0.3 g °· Plain, salted pretzels, 10 pieces / 1.8 g °· Whole-wheat bread, 1 slice / 1.9 g °· White bread, 1 slice / 0.7 g °· Raisin bread, 1 slice / 1.2 g °· Plain bagel, 3 oz / 2 g °· Flour tortilla, 1 oz / 0.9 g °· Corn tortilla, 1 small / 1.5 g °· Hamburger or hotdog bun, 1 small / 0.9 g °Fruits / Dietary Fiber (g) °· Apple with skin, 1 medium / 4.4 g °· Sweetened applesauce, ½ cup / 1.5 g °· Banana, ½ medium / 1.5 g °· Grapes, 10 grapes / 0.4 g °· Orange, 1 small / 2.3 g °· Raisin, 1.5 oz / 1.6 g °· Melon, 1 cup / 1.4 g °Vegetables / Dietary Fiber (g) °· Green beans (canned), ½ cup / 1.3 g °· Carrots (cooked), ½ cup / 2.3 g °· Broccoli (cooked), ½ cup / 2.8 g °· Peas (cooked), ½ cup / 4.4 g °· Mashed potatoes, ½ cup / 1.6 g °· Lettuce, 1 cup / 0.5 g °· Corn (canned), ½ cup / 1.6 g °· Tomato, ½ cup / 1.1 g °Document Released: 09/29/2005 Document Revised: 03/30/2012 Document Reviewed: 01/01/2012 °ExitCare® Patient Information ©2015 [employer]. This information is not intended to replace advice given to you by your health care provider. Make sure you discuss any questions you have with your health care provider. ° °

## 2015-05-03 NOTE — Op Note (Signed)
COLONOSCOPY PROCEDURE REPORT  PATIENT:  Monica Davis  MR#:  [id number] Birthdate:  1961-09-07, 54 y.o., female Endoscopist:  Dr. Rogene Houston, MD Referred By:  Dr. Wende Neighbors, MD  Procedure Date: 05/03/2015  Procedure:   Colonoscopy  Indications:  Patient is 54 year old Caucasian female was undergoing average risk screening colonoscopy.  Informed Consent:  The procedure and risks were reviewed with the patient and informed consent was obtained.  Medications:  Demerol 75 mg IV Versed 10 mg IV  Description of procedure:  After a digital rectal exam was performed, that colonoscope was advanced from the anus through the rectum and colon to the area of the cecum, ileocecal valve and appendiceal orifice. The cecum was deeply intubated. These structures were well-seen and photographed for the record. From the level of the cecum and ileocecal valve, the scope was slowly and cautiously withdrawn. The mucosal surfaces were carefully surveyed utilizing scope tip to flexion to facilitate fold flattening as needed. The scope was pulled down into the rectum where a thorough exam including retroflexion was performed.  Findings:   Prep satisfactory. Few scattered diverticula noted throughout the colon. Mucosal pigmentation noted to rectal mucosa. Small hemorrhoids below the dentate line and two anal papillae.   Therapeutic/Diagnostic Maneuvers Performed:   None  Complications:  None  Cecal Withdrawal Time:  8 minutes  Impression:  Examination performed to cecum. Pancolonic diverticulosis. Small external hemorrhoids and two anal papillae. Rectal melanosis coli.  Recommendations:  Standard instructions given. High fiber diet. Next screening exam in 10 years.  Brenly Trawick U  05/03/2015 9:00 AM  CC: Dr. Delphina Cahill, MD & Dr. Rayne Du ref. provider found

## 2015-05-03 NOTE — H&P (Signed)
Monica Davis is an 54 y.o. female.   Chief Complaint: Patient is here for colonoscopy. HPI: This 54 year old Caucasian female who is here for screening colonoscopy. She denies abdominal pain change in bowel habits or rectal bleeding. Family history is negative for CRC.  Past Medical History  Diagnosis Date  . Fibromyalgia   . Vaginal dryness 05/22/2014  . Depression   . Anxiety   . GERD (gastroesophageal reflux disease)   . Arthritis     Past Surgical History  Procedure Laterality Date  . Abdominal hysterectomy    . Cholecystectomy    . Hemorrhoid surgery      Family History  Problem Relation Age of Onset  . Diabetes Mother   . Fibromyalgia Mother   . COPD Father   . Pancreatitis Brother   . Diabetes Brother   . Alzheimer's disease Maternal Aunt   . Cancer Maternal Aunt     breast  . Alzheimer's disease Paternal Grandmother   . Cancer Other     breast,lung; maternal aunt   Social History:  reports that she has never smoked. She has never used smokeless tobacco. She reports that she drinks alcohol. She reports that she does not use illicit drugs.  Allergies: No Known Allergies  Medications Prior to Admission  Medication Sig Dispense Refill  . aspirin 81 MG tablet Take 81 mg by mouth daily.    . cyclobenzaprine (FLEXERIL) 10 MG tablet Take 10 mg by mouth 3 (three) times daily as needed for muscle spasms.    Marland Kitchen docusate sodium (COLACE) 100 MG capsule Take 100 mg by mouth 2 (two) times daily.    . DULoxetine (CYMBALTA) 60 MG capsule Take 60 mg by mouth daily.    Marland Kitchen HYDROcodone-acetaminophen (NORCO/VICODIN) 5-325 MG per tablet Take 1 tablet by mouth every 6 (six) hours as needed for moderate pain.    . Multiple Vitamins-Minerals (CENTRUM SILVER PO) Take 1 tablet by mouth daily.    Marland Kitchen omeprazole (PRILOSEC) 20 MG capsule Take 20 mg by mouth daily.    . polyethylene glycol-electrolytes (NULYTELY/GOLYTELY) 420 G solution Take 4,000 mLs by mouth once. 4000 mL 0  .  nystatin-triamcinolone (MYCOLOG II) cream APPLY TOPICALLY TO AFFECTED AREA(S) TWO TO THREE TIMES DAILY AS NEEDED (Patient not taking: Reported on 05/03/2015) 30 g 6    No results found for this or any previous visit (from the past 48 hour(s)). No results found.  ROS  Blood pressure 134/69, pulse 90, temperature 98.4 F (36.9 C), temperature source Oral, resp. rate 35, height 5\' 5"  (1.651 m), weight 225 lb (102.059 kg), SpO2 100 %. Physical Exam  Constitutional: She appears well-developed and well-nourished.  HENT:  Mouth/Throat: Oropharynx is clear and moist.  Eyes: Conjunctivae are normal. No scleral icterus.  Neck: No thyromegaly present.  Cardiovascular: Normal rate, regular rhythm and normal heart sounds.   No murmur heard. Respiratory: Effort normal and breath sounds normal.  GI: Soft. She exhibits no distension and no mass. There is no tenderness.  Musculoskeletal: She exhibits no edema.  Lymphadenopathy:    She has no cervical adenopathy.  Neurological: She is alert.  Skin: Skin is warm and dry.     Assessment/Plan Average risk screening colonoscopy.  REHMAN,NAJEEB U 05/03/2015, 8:22 AM

## 2015-05-04 ENCOUNTER — Encounter (HOSPITAL_COMMUNITY): Payer: Self-pay | Admitting: Internal Medicine

## 2015-05-28 ENCOUNTER — Other Ambulatory Visit: Payer: BC Managed Care – PPO | Admitting: Adult Health

## 2015-05-31 ENCOUNTER — Other Ambulatory Visit: Payer: BC Managed Care – PPO | Admitting: Adult Health

## 2015-08-17 ENCOUNTER — Telehealth (HOSPITAL_COMMUNITY): Payer: Self-pay | Admitting: *Deleted

## 2015-09-12 ENCOUNTER — Telehealth (HOSPITAL_COMMUNITY): Payer: Self-pay | Admitting: *Deleted

## 2015-11-06 ENCOUNTER — Ambulatory Visit (INDEPENDENT_AMBULATORY_CARE_PROVIDER_SITE_OTHER): Payer: BC Managed Care – PPO | Admitting: Psychiatry

## 2015-11-06 ENCOUNTER — Encounter (HOSPITAL_COMMUNITY): Payer: Self-pay | Admitting: Psychiatry

## 2015-11-06 DIAGNOSIS — F4323 Adjustment disorder with mixed anxiety and depressed mood: Secondary | ICD-10-CM

## 2015-11-06 NOTE — Patient Instructions (Signed)
Discussed orally 
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
## 2015-11-21 ENCOUNTER — Encounter (HOSPITAL_COMMUNITY): Payer: Self-pay | Admitting: Psychiatry

## 2015-11-21 ENCOUNTER — Ambulatory Visit (INDEPENDENT_AMBULATORY_CARE_PROVIDER_SITE_OTHER): Payer: BC Managed Care – PPO | Admitting: Psychiatry

## 2015-11-21 DIAGNOSIS — F4323 Adjustment disorder with mixed anxiety and depressed mood: Secondary | ICD-10-CM | POA: Diagnosis not present

## 2015-11-21 NOTE — Progress Notes (Signed)
   THERAPIST PROGRESS NOTE  Session Time: Wednesday 11/21/2015 3:03 PM - 4:05 PM  Participation Level: Active  Behavioral Response: CasualAlertDepressed/tearfulness  Type of Therapy: Individual Therapy  Treatment Goals:     1. Learn and implement calming skills to reduce and manage overall anxiety.       2. Identify what stages of Wynonia Hazard been experienced in the continue mom of the grieving process.       3. Begin verbalizing feelings associated with the loss.  Treatment Goals addressed:      Establish therapeutic alliance, 1, 2, 3  Interventions: Supportive  Summary: Monica Davis is a 55 y.o. female who presents with symptoms of anxiety and depression that have been present for several years.   Symtoms have worsened since her brother completed suicide in October 2016.  last year. She feels overwhelmed regarding her parents situation as she no longer has her brothrer's support.  She states feeling a lot of anxiety and emotional about parents and their living situation. Patient feels responsible as they're in the seventies. They don't have a social life. Patient reports recently learning the severity of mother's hoarding issues and feeling overwhelmed by this. Mother has become more depressed and has not left her home since patient's brother's death. Patient also has unresolved grief/loss issues about brother's death.  She worries about a variety of other issues including her adult children. 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 She reports  tearfulness, worry, depressed mood, ruminating thoughts, low energy, poor concentration,and  feelings of hopelessness.  Patient reports little to no change in symptoms since assessment appointment. She continues to experience tearfulness, depressed mood, and anxiety. She states difficulty relaxing. She states she has been up and down and is experiencing increased physical pain due  to fibromyalgia but is pushing herself to work. She has had increased thoughts about deceased brother and reports they were very close. Her grief is even more complicated due to brother completing suicide. She is pleased mother finally got out the house for a few hours last week.   Suicidal/Homicidal: No  Therapist Response: Therapist works with patient to establish rapport, review symptoms, identify and verbalize feelings, identify strengths, identify support, develop treatment plan, discuss rationale for and practice controlled breathing  Plan: Return again in 1-2 weeks. Patient agrees to practice controlled breathing 5-10 minutes 2 times per day, complete breathing log, and bring to next session  Diagnosis: Axis I: Adjustment Disorder with Mixed Anxiety and Depressed Mood    Axis II: Deferred    Monica Davis,PEGGY, LCSW 11/21/2015

## 2015-11-21 NOTE — Patient Instructions (Signed)
Discussed orally 

## 2015-12-04 ENCOUNTER — Telehealth (HOSPITAL_COMMUNITY): Payer: Self-pay | Admitting: *Deleted

## 2015-12-05 ENCOUNTER — Ambulatory Visit (HOSPITAL_COMMUNITY): Payer: Self-pay | Admitting: Psychiatry

## 2015-12-19 ENCOUNTER — Ambulatory Visit (HOSPITAL_COMMUNITY): Payer: Self-pay | Admitting: Psychiatry

## 2016-01-07 ENCOUNTER — Encounter: Payer: Self-pay | Admitting: *Deleted

## 2016-01-08 ENCOUNTER — Ambulatory Visit: Payer: BC Managed Care – PPO | Admitting: Neurology

## 2016-01-17 ENCOUNTER — Ambulatory Visit (INDEPENDENT_AMBULATORY_CARE_PROVIDER_SITE_OTHER): Payer: BC Managed Care – PPO | Admitting: Neurology

## 2016-01-17 ENCOUNTER — Encounter: Payer: Self-pay | Admitting: Neurology

## 2016-01-17 VITALS — BP 112/74 | HR 92 | Ht 65.0 in | Wt 234.0 lb

## 2016-01-17 DIAGNOSIS — G479 Sleep disorder, unspecified: Secondary | ICD-10-CM

## 2016-01-17 DIAGNOSIS — M797 Fibromyalgia: Secondary | ICD-10-CM | POA: Diagnosis not present

## 2016-01-17 NOTE — Progress Notes (Signed)
Reason for visit: Fibromyalgia  Referring physician: Dr. Jeannette How is a 55 y.o. female  History of present illness:  Monica Davis is a 55 year old right-handed white female with an 18 year history of fibromyalgia. The patient initially was seen by Dr. Kristen Loader, and the diagnosis of fibromyalgia was made. This began shortly after her hysterectomy. The patient began having some low back pain and shoulder discomfort that would migrate around, oftentimes associated with hip pain as well. The patient has been on a multitude of medications over the years. She currently is on Cymbalta taking 60 mg twice daily, she has been on Lyrica previously but this resulted in ankle swelling and was discontinued. According to the medical records, the patient has been on gabapentin and Savella in the past as well without benefit, the patient herself does not recall being on gabapentin. The patient has also been treated with Ritalin in the past without benefit. She has been placed on Prozac recently. Over the years, she has learned to deal with the fibromyalgia pain, she has been able to maintain gainful employment. Within the last 3 years she has had increased stress with her job, in October 2016 her brother committed suicide which resulted in a significant increase in stress. The patient has seen a psychiatrist in the past, and she recently made contact with another psychiatrist for psychiatric care. The patient has come out of work on short-term disability. She reports a significant increase in all of her symptoms including fatigue, neuromuscular discomfort, and difficulty with sleeping. The patient wakes up frequently at night, she snores at night, she may have early morning headaches. She has some drowsiness during the day. The patient reports no focal numbness or weakness of the face, arms, or legs. She does have some arthritic pains in the hands. She has developed a significant anxiety disorder with  panic attacks associated with closing up of her throat, and chest symptoms shortness of breath. The patient reports some decline in cognitive processing, with decreased memory. She is sent to this office for an evaluation. The patient has noted in the past that physical activity seems to help some of her symptoms. The patient is not particularly active however at this time.  Past Medical History  Diagnosis Date  . Fibromyalgia   . Vaginal dryness 05/22/2014  . Depression   . Anxiety   . GERD (gastroesophageal reflux disease)   . Arthritis   . Insomnia     Past Surgical History  Procedure Laterality Date  . Abdominal hysterectomy    . Cholecystectomy    . Hemorrhoid surgery    . Colonoscopy N/A 05/03/2015    Procedure: COLONOSCOPY;  Surgeon: Rogene Houston, MD;  [facility];  Service: Endoscopy;  Laterality: N/A;  63    Family History  Problem Relation Age of Onset  . Diabetes Mother   . Fibromyalgia Mother   . Anxiety disorder Mother   . OCD Mother   . Asthma Mother   . Hypertension Mother     Brother, benign essential  . COPD Father   . Asthma Father   . Pancreatitis Brother   . Diabetes Brother   . Alzheimer's disease Maternal Aunt   . Cancer Maternal Aunt     breast  . Alzheimer's disease Paternal Grandmother   . Cancer Other     breast,lung; maternal aunt  . Asthma Brother     Social history:  reports that she has never smoked.  She has never used smokeless tobacco. She reports that she drinks alcohol. She reports that she does not use illicit drugs.  Medications:  Prior to Admission medications   Medication Sig Start Date End Date Taking? Authorizing Provider  ALPRAZolam Duanne Moron) 0.5 MG tablet Take 0.5 mg by mouth at bedtime as needed for anxiety.   Yes Historical Provider, MD  aspirin 81 MG tablet Take 81 mg by mouth daily.   Yes Historical Provider, MD  cyclobenzaprine (FLEXERIL) 10 MG tablet Take 10 mg by mouth 3 (three) times daily as needed for  muscle spasms.   Yes Historical Provider, MD  docusate sodium (COLACE) 100 MG capsule Take 100 mg by mouth 2 (two) times daily.   Yes Historical Provider, MD  DULoxetine (CYMBALTA) 60 MG capsule Take 60 mg by mouth daily.   Yes Historical Provider, MD  FLUoxetine (PROZAC) 20 MG capsule Take 20 mg by mouth daily.   Yes Historical Provider, MD  HYDROcodone-acetaminophen (NORCO/VICODIN) 5-325 MG tablet Take 1 tablet by mouth every 6 (six) hours as needed for moderate pain.   Yes Historical Provider, MD  Multiple Vitamins-Minerals (CENTRUM SILVER PO) Take 1 tablet by mouth daily.   Yes Historical Provider, MD  Multiple Vitamins-Minerals (CENTRUM SILVER PO) Take by mouth. One tablet daily   Yes Historical Provider, MD  naproxen sodium (ALEVE) 220 MG tablet Take 220 mg by mouth 2 (two) times daily with a meal.   Yes Historical Provider, MD  nystatin-triamcinolone (MYCOLOG II) cream APPLY TOPICALLY TO AFFECTED AREA(S) TWO TO THREE TIMES DAILY AS NEEDED 05/22/14  Yes Estill Dooms, NP  omeprazole (PRILOSEC) 20 MG capsule Take 20 mg by mouth daily.   Yes Historical Provider, MD  Omega-3 Fatty Acids (FISH OIL) 1000 MG CAPS Take by mouth. Reported on 01/17/2016    Historical Provider, MD     No Known Allergies  ROS:  Out of a complete 14 system review of symptoms, the patient complains only of the following symptoms, and all other reviewed systems are negative.  Fatigue Palpitations of the heart Itching, moles Shortness of breath, snoring Easy bruising Feeling hot, cold Joint pain, joint swelling, muscle cramps, aching muscles Skin sensitivity Memory loss, confusion, numbness, weakness Depression, anxiety, decreased energy, change in appetite, racing thoughts  Blood pressure 112/74, pulse 92, height 5\' 5"  (1.651 m), weight 234 lb (106.142 kg).  Physical Exam  General: The patient is alert and cooperative at the time of the examination. The patient is markedly obese.  Eyes: Pupils are  equal, round, and reactive to light. Discs are flat bilaterally.  Neck: The neck is supple, no carotid bruits are noted.  Respiratory: The respiratory examination is clear.  Cardiovascular: The cardiovascular examination reveals a regular rate and rhythm, no obvious murmurs or rubs are noted.  Skin: Extremities are without significant edema.  Neurologic Exam  Mental status: The patient is alert and oriented x 3 at the time of the examination. The patient has apparent normal recent and remote memory, with an apparently normal attention span and concentration ability.  Cranial nerves: Facial symmetry is present. There is good sensation of the face to pinprick and soft touch bilaterally. The strength of the facial muscles and the muscles to head turning and shoulder shrug are normal bilaterally. Speech is well enunciated, no aphasia or dysarthria is noted. Extraocular movements are full. Visual fields are full. The tongue is midline, and the patient has symmetric elevation of the soft palate. No obvious hearing deficits are noted.  Motor: The motor testing reveals 5 over 5 strength of all 4 extremities. Good symmetric motor tone is noted throughout.  Sensory: Sensory testing is intact to pinprick, soft touch, vibration sensation, and position sense on all 4 extremities. No evidence of extinction is noted.  Coordination: Cerebellar testing reveals good finger-nose-finger and heel-to-shin bilaterally.  Gait and station: Gait is normal. Tandem gait is normal. Romberg is negative. No drift is seen.  Reflexes: Deep tendon reflexes are symmetric and normal bilaterally. Toes are downgoing bilaterally.   Assessment/Plan:  1. Fibromyalgia  2. Anxiety and depression, panic disorder  3. Degenerative arthritis  The patient has a long-standing history of fibromyalgia. The symptoms have been worsened recently with increased emotional distress associated with her job and with the death of her  brother. The patient has been referred to a psychiatrist appropriately. The patient has some difficulty with sleep, and snoring at night. She may have some overlying obstructive sleep apnea which may be exacerbating her fibromyalgia symptoms. Some of her pain is related to arthritis. The patient is on Cymbalta which seemed to help, this is to be continued. We may consider addition of Gralise or Horizant in the future which may minimize some of the side effects from the gabapentin. I will refer the patient for a sleep evaluation, if the patient does not have sleep apnea, we will add Gralise. She will follow-up in 5 months. I have emphasized the need for the patient to enter into a regular exercise program, I have recommended going through the [facility] and getting a personal trainer to develop a home exercise program that she can continue.  Jill Alexanders MD 01/17/2016 7:51 PM  [facility] [address] [place], [place] [address]  Phone [phone] Fax [phone]

## 2016-01-17 NOTE — Patient Instructions (Signed)

## 2016-01-23 ENCOUNTER — Institutional Professional Consult (permissible substitution): Payer: BC Managed Care – PPO | Admitting: Neurology

## 2016-01-24 ENCOUNTER — Institutional Professional Consult (permissible substitution): Payer: BC Managed Care – PPO | Admitting: Neurology

## 2016-02-04 ENCOUNTER — Institutional Professional Consult (permissible substitution): Payer: Self-pay | Admitting: Neurology

## 2016-02-07 ENCOUNTER — Encounter: Payer: Self-pay | Admitting: Neurology

## 2016-02-07 ENCOUNTER — Ambulatory Visit (INDEPENDENT_AMBULATORY_CARE_PROVIDER_SITE_OTHER): Payer: BC Managed Care – PPO | Admitting: Neurology

## 2016-02-07 DIAGNOSIS — R5382 Chronic fatigue, unspecified: Secondary | ICD-10-CM

## 2016-02-07 DIAGNOSIS — R51 Headache: Secondary | ICD-10-CM

## 2016-02-07 DIAGNOSIS — G9332 Myalgic encephalomyelitis/chronic fatigue syndrome: Secondary | ICD-10-CM | POA: Insufficient documentation

## 2016-02-07 DIAGNOSIS — M797 Fibromyalgia: Secondary | ICD-10-CM

## 2016-02-07 DIAGNOSIS — R519 Headache, unspecified: Secondary | ICD-10-CM | POA: Insufficient documentation
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
## 2016-02-07 NOTE — Patient Instructions (Signed)
Myofascial Pain Syndrome and Fibromyalgia  Myofascial pain syndrome and fibromyalgia are both pain disorders. This pain may be felt mainly in your muscles.   · Myofascial pain syndrome:    Always has trigger points or tender points in the muscle that will cause pain when pressed. The pain may come and go.    Usually affects your neck, upper back, and shoulder areas. The pain often radiates into your arms and hands.  · Fibromyalgia:    Has muscle pains and tenderness that come and go.    Is often associated with fatigue and sleep disturbances.    Has trigger points.    Tends to be long-lasting (chronic), but is not life-threatening.  Fibromyalgia and myofascial pain are not the same. However, they often occur together. If you have both conditions, each can make the other worse. Both are common and can cause enough pain and fatigue to make day-to-day activities difficult.   CAUSES   The exact causes of fibromyalgia and myofascial pain are not known. People with certain gene types may be more likely to develop fibromyalgia. Some factors can be triggers for both conditions, such as:   · Spine disorders.  · Arthritis.  · Severe injury (trauma) and other physical stressors.  · Being under a lot of stress.  · A medical illness.  SIGNS AND SYMPTOMS   Fibromyalgia  The main symptom of fibromyalgia is widespread pain and tenderness in your muscles. This can vary over time. Pain is sometimes described as stabbing, shooting, or burning. You may have tingling or numbness, too. You may also have sleep problems and fatigue. You may wake up feeling tired and groggy (fibro fog). Other symptoms may include:   · Bowel and bladder problems.  · Headaches.  · Visual problems.  · Problems with odors and noises.  · Depression or mood changes.  · Painful menstrual periods (dysmenorrhea).  · Dry skin or eyes.  Myofascial pain syndrome  Symptoms of myofascial pain syndrome include:   · Tight, ropy bands of muscle.    · Uncomfortable  sensations in muscular areas, such as:    Aching.    Cramping.    Burning.    Numbness.    Tingling.      Muscle weakness.  · Trouble moving certain muscles freely (range of motion).  DIAGNOSIS   There are no specific tests to diagnose fibromyalgia or myofascial pain syndrome. Both can be hard to diagnose because their symptoms are common in many other conditions. Your health care provider may suspect one or both of these conditions based on your symptoms and medical history. Your health care provider will also do a physical exam.   The key to diagnosing fibromyalgia is having pain, fatigue, and other symptoms for more than three months that cannot be explained by another condition.   The key to diagnosing myofascial pain syndrome is finding trigger points in muscles that are tender and cause pain elsewhere in your body (referred pain).  TREATMENT   Treating fibromyalgia and myofascial pain often requires a team of health care providers. This usually starts with your primary provider and a physical therapist. You may also find it helpful to work with alternative health care providers, such as massage therapists or acupuncturists.  Treatment for fibromyalgia may include medicines. This may include nonsteroidal anti-inflammatory drugs (NSAIDs), along with other medicines.   Treatment for myofascial pain may also include:  · NSAIDs.  · Cooling and stretching of muscles.  · Trigger point injections.  ·   Sound wave (ultrasound) treatments to stimulate muscles.  HOME CARE INSTRUCTIONS   · Take medicines only as directed by your health care provider.  · Exercise as directed by your health care provider or physical therapist.  · Try to avoid stressful situations.  · Practice relaxation techniques to control your stress. You may want to try:    Biofeedback.    Visual imagery.    Hypnosis.    Muscle relaxation.    Yoga.    Meditation.  · Talk to your health care provider about alternative treatments, such as acupuncture or  massage treatment.  · Maintain a healthy lifestyle. This includes eating a healthy diet and getting enough sleep.  · Consider joining a support group.  · Do not do activities that stress or strain your muscles. That includes repetitive motions and heavy lifting.  SEEK MEDICAL CARE IF:   · You have new symptoms.  · Your symptoms get worse.  · You have side effects from your medicines.  · You have trouble sleeping.  · Your condition is causing depression or anxiety.  FOR MORE INFORMATION   · National Fibromyalgia Association: http://www.fmaware.orgwww.fmaware.org  · Arthritis Foundation: http://www.arthritis.orgwww.arthritis.org  · American Chronic Pain Association: http://www.theacpa.org/condition/myofascial-painwww.theacpa.org/condition/myofascial-pain     This information is not intended to replace advice given to you by your health care provider. Make sure you discuss any questions you have with your health care provider.     Document Released: 09/29/2005 Document Revised: 10/20/2014 Document Reviewed: 07/05/2014  Elsevier Interactive Patient Education ©2016 Elsevier Inc.

## 2016-02-28 ENCOUNTER — Telehealth: Payer: Self-pay | Admitting: *Deleted

## 2016-02-28 NOTE — Telephone Encounter (Signed)
------------------------------------------------------------   [place] [facility]  CID [id number]  Patient Monica Davis      Pt's Dr Jannifer Franklin       Area Code [id number] Phone# [phone] * DOB [phone]      RE SHE'S CHECKING THE STATUS OF [place]                                              Disp:Y/N N If Y = C/B If No Response In 108minutes ============================================================ Records fax on 02/28/16

## 2016-03-07 DIAGNOSIS — Z029 Encounter for administrative examinations, unspecified: Secondary | ICD-10-CM

## 2016-03-17 DIAGNOSIS — Z0271 Encounter for disability determination: Secondary | ICD-10-CM

## 2016-04-24 ENCOUNTER — Ambulatory Visit (INDEPENDENT_AMBULATORY_CARE_PROVIDER_SITE_OTHER): Payer: BC Managed Care – PPO | Admitting: Neurology

## 2016-04-24 DIAGNOSIS — G4733 Obstructive sleep apnea (adult) (pediatric): Secondary | ICD-10-CM | POA: Diagnosis not present

## 2016-04-24 DIAGNOSIS — R51 Headache: Secondary | ICD-10-CM

## 2016-04-24 DIAGNOSIS — R5382 Chronic fatigue, unspecified: Secondary | ICD-10-CM

## 2016-04-24 DIAGNOSIS — M797 Fibromyalgia: Secondary | ICD-10-CM

## 2016-04-24 DIAGNOSIS — R519 Headache, unspecified: Secondary | ICD-10-CM

## 2016-04-30 ENCOUNTER — Other Ambulatory Visit: Payer: Self-pay | Admitting: Adult Health

## 2016-04-30 DIAGNOSIS — Z1231 Encounter for screening mammogram for malignant neoplasm of breast: Secondary | ICD-10-CM

## 2016-05-06 ENCOUNTER — Telehealth: Payer: Self-pay

## 2016-05-06 DIAGNOSIS — G4733 Obstructive sleep apnea (adult) (pediatric): Secondary | ICD-10-CM

## 2016-05-06 NOTE — Telephone Encounter (Signed)
I spoke to pt regarding her sleep study results. I advised her that her sleep study revealed severe, mostly obstructive sleep apnea and Dr. Brett Fairy recommends starting a cpap. Pt says she would start cpap if she can afford it. She wants to know how much it will cost. I advised her that I can send the order to [facility] and they can discuss the financials of starting a cpap with her. If pt decides NOT to start cpap, pt understands that she needs to call me to make a sooner appt. Pt verbalized understanding of results. Pt had no questions at this time but was encouraged to call back if questions arise.

## 2016-05-07 ENCOUNTER — Ambulatory Visit (HOSPITAL_COMMUNITY)
Admission: RE | Admit: 2016-05-07 | Discharge: 2016-05-07 | Disposition: A | Discharge status: Home / Self Care | Payer: BC Managed Care – PPO | Source: Ambulatory Visit | Attending: Adult Health | Admitting: Adult Health

## 2016-05-07 DIAGNOSIS — Z1231 Encounter for screening mammogram for malignant neoplasm of breast: Secondary | ICD-10-CM | POA: Insufficient documentation

## 2016-06-25 ENCOUNTER — Ambulatory Visit (INDEPENDENT_AMBULATORY_CARE_PROVIDER_SITE_OTHER): Payer: BC Managed Care – PPO | Admitting: Neurology

## 2016-06-25 ENCOUNTER — Encounter: Payer: Self-pay | Admitting: Neurology

## 2016-06-25 VITALS — BP 104/68 | HR 96 | Wt 234.5 lb

## 2016-06-25 DIAGNOSIS — G4733 Obstructive sleep apnea (adult) (pediatric): Secondary | ICD-10-CM | POA: Diagnosis not present

## 2016-06-25 DIAGNOSIS — M797 Fibromyalgia: Secondary | ICD-10-CM | POA: Diagnosis not present

## 2016-06-25 HISTORY — DX: Obstructive sleep apnea (adult) (pediatric): G47.33

## 2016-06-25 MED ORDER — GABAPENTIN (ONCE-DAILY) 300 MG PO TABS: ORAL_TABLET | ORAL | 2 refills | Status: DC

## 2016-06-25 NOTE — Progress Notes (Signed)
Reason for visit: Fibromyalgia  Monica Davis is an 55 y.o. female  History of present illness:  Monica Davis is a 55 year old right-handed white female with a history of long-standing fibromyalgia and a history of obesity. The patient has recently undergone a sleep study that confirmed the presence of sleep apnea, but the patient currently is not working, she does not believe that she can afford to initiate CPAP treatment. The patient continues to have significant issues with depression, she will be seeing a psychiatrist in the near future. The patient is relatively inactive. She reports a lot of arthralgias in the hands and legs that seems to worsen with cooler weather. The patient remains on Cymbalta at 90 mg daily, she could not tolerate the 120 mg dose. The patient returns to the office today for an evaluation.  Past Medical History:  Diagnosis Date  . Anxiety   . Arthritis   . Depression   . Fibromyalgia   . GERD (gastroesophageal reflux disease)   . Insomnia   . Vaginal dryness 05/22/2014    Past Surgical History:  Procedure Laterality Date  . ABDOMINAL HYSTERECTOMY    . CHOLECYSTECTOMY    . COLONOSCOPY N/A 05/03/2015   Procedure: COLONOSCOPY;  Surgeon: Rogene Houston, MD;  Location[facility];  Service: Endoscopy;  Laterality: N/A;  830  . HEMORRHOID SURGERY      Family History  Problem Relation Age of Onset  . Diabetes Mother   . Fibromyalgia Mother   . Anxiety disorder Mother   . OCD Mother   . Asthma Mother   . Hypertension Mother     Brother, benign essential  . COPD Father   . Asthma Father   . Pancreatitis Brother   . Diabetes Brother   . Alzheimer's disease Maternal Aunt   . Cancer Maternal Aunt     breast  . Alzheimer's disease Paternal Grandmother   . Cancer Other     breast,lung; maternal aunt  . Asthma Brother     Social history:  reports that she has never smoked. She has never used smokeless tobacco. She reports that she drinks  alcohol. She reports that she does not use drugs.   No Known Allergies  Medications:  Prior to Admission medications   Medication Sig Start Date End Date Taking? Authorizing Provider  ALPRAZolam Duanne Moron) 0.5 MG tablet Take 0.5 mg by mouth at bedtime as needed for anxiety.   Yes Historical Provider, MD  aspirin 81 MG tablet Take 81 mg by mouth daily.   Yes Historical Provider, MD  atorvastatin (LIPITOR) 20 MG tablet  06/20/16  Yes Historical Provider, MD  cyclobenzaprine (FLEXERIL) 10 MG tablet Take 10 mg by mouth 3 (three) times daily as needed for muscle spasms.   Yes Historical Provider, MD  docusate sodium (COLACE) 100 MG capsule Take 100 mg by mouth 2 (two) times daily.   Yes Historical Provider, MD  DULoxetine (CYMBALTA) 30 MG capsule  06/20/16  Yes Historical Provider, MD  DULoxetine (CYMBALTA) 60 MG capsule Take 60 mg by mouth daily.   Yes Historical Provider, MD  HYDROcodone-acetaminophen (NORCO/VICODIN) 5-325 MG tablet Take 1 tablet by mouth every 6 (six) hours as needed for moderate pain.   Yes Historical Provider, MD  Multiple Vitamins-Minerals (CENTRUM SILVER PO) Take by mouth. One tablet daily   Yes Historical Provider, MD  naproxen sodium (ALEVE) 220 MG tablet Take 220 mg by mouth 2 (two) times daily with a meal.   Yes Historical  Provider, MD  nystatin cream (MYCOSTATIN)  06/18/16  Yes Historical Provider, MD  nystatin-triamcinolone (MYCOLOG II) cream APPLY TOPICALLY TO AFFECTED AREA(S) TWO TO THREE TIMES DAILY AS NEEDED 05/22/14  Yes Estill Dooms, NP  Omega-3 Fatty Acids (FISH OIL) 1000 MG CAPS Take by mouth. Reported on 01/17/2016   Yes Historical Provider, MD  omeprazole (PRILOSEC) 20 MG capsule Take 20 mg by mouth daily.   Yes Historical Provider, MD  QUEtiapine (SEROQUEL) 25 MG tablet  06/17/16  Yes Historical Provider, MD    ROS:  Out of a complete 14 system review of symptoms, the patient complains only of the following symptoms, and all other reviewed systems are  negative.  Constipation, diarrhea, nausea Joint pain, joint swelling, back pain, achy muscles, walking difficulty Itching Agitation, depression, anxiety  Blood pressure 104/68, pulse 96, weight 234 lb 8 oz (106.4 kg).  Physical Exam  General: The patient is alert and cooperative at the time of the examination. The patient is markedly obese.  Skin: No significant peripheral edema is noted.   Neurologic Exam  Mental status: The patient is alert and oriented x 3 at the time of the examination. The patient has apparent normal recent and remote memory, with an apparently normal attention span and concentration ability.   Cranial nerves: Facial symmetry is present. Speech is normal, no aphasia or dysarthria is noted. Extraocular movements are full. Visual fields are full.  Motor: The patient has good strength in all 4 extremities.  Sensory examination: Soft touch sensation is symmetric on the face, arms, and legs.  Coordination: The patient has good finger-nose-finger and heel-to-shin bilaterally.  Gait and station: The patient has a normal gait. Tandem gait is normal. Romberg is negative. No drift is seen.  Reflexes: Deep tendon reflexes are symmetric.   Assessment/Plan:  1. Fibromyalgia  2. Sleep apnea  3. Arthralgias  The patient may go on Aleve taking 2 tablets twice daily. We will give the patient a trial on Gralise taking 300 mg daily for 2 weeks then go to 600 mg daily. If she tolerates the medication, we could go higher on the dose in the future. The patient is encouraged to begin to maintain some level of exercise and activity. Treatment of the sleep apnea in the future may actually improve her underlying fatigue and her neuromuscular discomfort. She will follow-up in 6 months.  Jill Alexanders MD 06/25/2016 8:23 AM  [facility] [address] [place], [place] [address]  Phone [phone] Fax [phone]

## 2016-06-27 ENCOUNTER — Telehealth: Payer: Self-pay | Admitting: Neurology

## 2016-06-27 NOTE — Telephone Encounter (Signed)
I called the pharmacy, they cannot get Gralise in the 300 mg capsule. They will use [facility] to get her to 600 mg after 2 weeks, then we can convert to the 600 mg capsule of Gralise at that time.

## 2016-06-27 NOTE — Telephone Encounter (Signed)
[facility]/ [facility] pharmacy called stating Gabapentin, Once-Daily, (GRALISE) 300 MG TABS is not available. The 600 mg tablet is available. Please call [phone]

## 2016-07-02 NOTE — Telephone Encounter (Signed)
Received this notification from [place]: "I just wanted to let you know that I spoke with her 05/07/2016 and she advised me that she would have to discuss financials with her husband and would call back to schedule, we called 06/06/2016 to follow up with her and left a Voicemail and I have tried again today to follow up and left a voicemail again."

## 2016-07-04 ENCOUNTER — Encounter (HOSPITAL_COMMUNITY): Payer: Self-pay | Admitting: *Deleted

## 2016-07-04 ENCOUNTER — Emergency Department (HOSPITAL_COMMUNITY)
Admission: EM | Admit: 2016-07-04 | Discharge: 2016-07-04 | Disposition: A | Discharge status: Home / Self Care | Payer: BC Managed Care – PPO | Attending: Emergency Medicine | Admitting: Emergency Medicine

## 2016-07-04 ENCOUNTER — Emergency Department (HOSPITAL_COMMUNITY): Payer: BC Managed Care – PPO

## 2016-07-04 DIAGNOSIS — Y939 Activity, unspecified: Secondary | ICD-10-CM | POA: Diagnosis not present

## 2016-07-04 DIAGNOSIS — Y929 Unspecified place or not applicable: Secondary | ICD-10-CM | POA: Insufficient documentation

## 2016-07-04 DIAGNOSIS — S99911A Unspecified injury of right ankle, initial encounter: Secondary | ICD-10-CM | POA: Diagnosis present

## 2016-07-04 DIAGNOSIS — Z79899 Other long term (current) drug therapy: Secondary | ICD-10-CM | POA: Insufficient documentation

## 2016-07-04 DIAGNOSIS — Z23 Encounter for immunization: Secondary | ICD-10-CM | POA: Insufficient documentation

## 2016-07-04 DIAGNOSIS — S93401A Sprain of unspecified ligament of right ankle, initial encounter: Secondary | ICD-10-CM | POA: Insufficient documentation

## 2016-07-04 DIAGNOSIS — Y999 Unspecified external cause status: Secondary | ICD-10-CM | POA: Diagnosis not present

## 2016-07-04 DIAGNOSIS — X501XXA Overexertion from prolonged static or awkward postures, initial encounter: Secondary | ICD-10-CM | POA: Insufficient documentation

## 2016-07-04 DIAGNOSIS — Z7982 Long term (current) use of aspirin: Secondary | ICD-10-CM | POA: Diagnosis not present

## 2016-07-04 MED ORDER — TETANUS-DIPHTH-ACELL PERTUSSIS 5-2.5-18.5 LF-MCG/0.5 IM SUSP
0.5000 mL | Freq: Once | INTRAMUSCULAR | Status: AC
  Administered 2016-07-04: 0.5 mL via INTRAMUSCULAR
  Filled 2016-07-04: qty 0.5

## 2016-07-04 NOTE — ED Notes (Signed)
Patient transported to X-ray 

## 2016-07-04 NOTE — ED Provider Notes (Signed)
[facility] DEPT Provider Note   CSN: [id number] Arrival date & time: 07/04/16  0807     History   Chief Complaint No chief complaint on file.   HPI Monica Davis is a 55 y.o. female with past medical history of significant for fibromyalgia and depression presenting with ankle swelling. Patient states that this morning she was stepping down her porch steps and she stumbled falling down 3 steps. She states that she fell on her right ankle. However note scraping her left foot and possibly banging her shoulder. She denies hitting her head or any loss of consciousness. She denies any other source of pain currently. She indicates that she was unable to apply weight on her right foot due to the pain, though right now laying down and she denies having any pain. Denies any headache, LOC, dizziness.   HPI  Past Medical History:  Diagnosis Date  . Anxiety   . Arthritis   . Depression   . Fibromyalgia   . GERD (gastroesophageal reflux disease)   . Insomnia   . OSA (obstructive sleep apnea) 06/25/2016  . Vaginal dryness 05/22/2014    Patient Active Problem List   Diagnosis Date Noted  . OSA (obstructive sleep apnea) 06/25/2016  . Morbid obesity due to excess calories ([place]) 02/07/2016  . Sleep related headaches 02/07/2016  . Chronic fatigue fibromyalgia syndrome 02/07/2016  . Fibromyalgia 01/17/2016  . Vaginal dryness 05/22/2014    Past Surgical History:  Procedure Laterality Date  . ABDOMINAL HYSTERECTOMY    . CHOLECYSTECTOMY    . COLONOSCOPY N/A 05/03/2015   Procedure: COLONOSCOPY;  Surgeon: Rogene Houston, MD;  [facility];  Service: Endoscopy;  Laterality: N/A;  830  . HEMORRHOID SURGERY      OB History    Gravida Para Term Preterm AB Living   3 2     1 2    SAB TAB Ectopic Multiple Live Births   1               Home Medications    Prior to Admission medications   Medication Sig Start Date End Date Taking? Authorizing Provider  ALPRAZolam Duanne Moron)  0.5 MG tablet Take 0.5 mg by mouth at bedtime as needed for anxiety.    Historical Provider, MD  aspirin 81 MG tablet Take 81 mg by mouth daily.    Historical Provider, MD  atorvastatin (LIPITOR) 20 MG tablet  06/20/16   Historical Provider, MD  cyclobenzaprine (FLEXERIL) 10 MG tablet Take 10 mg by mouth 3 (three) times daily as needed for muscle spasms.    Historical Provider, MD  docusate sodium (COLACE) 100 MG capsule Take 100 mg by mouth 2 (two) times daily.    Historical Provider, MD  DULoxetine (CYMBALTA) 30 MG capsule  06/20/16   Historical Provider, MD  DULoxetine (CYMBALTA) 60 MG capsule Take 60 mg by mouth daily.    Historical Provider, MD  Gabapentin, Once-Daily, (GRALISE) 300 MG TABS One capsule daily with supper for 2 weeks, then take 2 capsules daily 06/25/16   Kathrynn Ducking, MD  HYDROcodone-acetaminophen (NORCO/VICODIN) 5-325 MG tablet Take 1 tablet by mouth every 6 (six) hours as needed for moderate pain.    Historical Provider, MD  Multiple Vitamins-Minerals (CENTRUM SILVER PO) Take by mouth. One tablet daily    Historical Provider, MD  naproxen sodium (ALEVE) 220 MG tablet Take 220 mg by mouth 2 (two) times daily with a meal.    Historical Provider, MD  nystatin cream (  MYCOSTATIN)  06/18/16   Historical Provider, MD  nystatin-triamcinolone (MYCOLOG II) cream APPLY TOPICALLY TO AFFECTED AREA(S) TWO TO THREE TIMES DAILY AS NEEDED 05/22/14   Estill Dooms, NP  Omega-3 Fatty Acids (FISH OIL) 1000 MG CAPS Take by mouth. Reported on 01/17/2016    Historical Provider, MD  omeprazole (PRILOSEC) 20 MG capsule Take 20 mg by mouth daily.    Historical Provider, MD  QUEtiapine (SEROQUEL) 25 MG tablet  06/17/16   Historical Provider, MD    Family History Family History  Problem Relation Age of Onset  . Diabetes Mother   . Fibromyalgia Mother   . Anxiety disorder Mother   . OCD Mother   . Asthma Mother   . Hypertension Mother     Brother, benign essential  . COPD Father   . Asthma Father    . Pancreatitis Brother   . Diabetes Brother   . Alzheimer's disease Maternal Aunt   . Cancer Maternal Aunt     breast  . Alzheimer's disease Paternal Grandmother   . Cancer Other     breast,lung; maternal aunt  . Asthma Brother     Social History Social History  Substance Use Topics  . Smoking status: Never Smoker  . Smokeless tobacco: Never Used  . Alcohol use 0.0 oz/week     Comment: occ  Margarita 1-2 month     Allergies   Review of patient's allergies indicates no known allergies.   Review of Systems Review of Systems  Constitutional: Negative for chills and fever.  HENT: Negative for congestion and sore throat.   Eyes: Negative for pain and visual disturbance.  Respiratory: Negative for cough and shortness of breath.   Cardiovascular: Negative for chest pain and palpitations.  Gastrointestinal: Negative for abdominal pain, nausea and vomiting.  Genitourinary: Negative for dysuria and flank pain.  Musculoskeletal: Positive for joint swelling. Negative for back pain and neck pain.  Skin: Negative for color change and rash.  Neurological: Negative for dizziness, light-headedness and headaches.     Physical Exam Updated Vital Signs There were no vitals taken for this visit.  Physical Exam  Constitutional: She appears well-developed and well-nourished.  HENT:  Head: Normocephalic and atraumatic.  Eyes: Conjunctivae and EOM are normal. Pupils are equal, round, and reactive to light.  Neck: Normal range of motion. Neck supple.  Cardiovascular: Normal rate, regular rhythm, normal heart sounds and intact distal pulses.   Pulmonary/Chest: Effort normal and breath sounds normal.  Abdominal: Soft. Bowel sounds are normal.  Musculoskeletal: Normal range of motion.       Feet:  Normal dorsiflexion and plantar flexion on right foot with 5 out of 5 strength. Decreased eversion and inversion 4 out of 5 compared to left side.   Skin: Skin is warm and dry. Capillary  refill takes less than 2 seconds.     ED Treatments / Results  Labs (all labs ordered are listed, but only abnormal results are displayed) Labs Reviewed - No data to display  EKG  EKG Interpretation None       Radiology No results found.  Procedures Procedures (including critical care time)  Medications Ordered in ED Medications - No data to display   Initial Impression / Assessment and Plan / ED Course  I have reviewed the triage vital signs and the nursing notes.  Pertinent labs & imaging results that were available during my care of the patient were reviewed by me and considered in my medical decision making (see chart  for details).  Clinical Course   Patient presenting with right ankle swelling over lateral malleolus  following a mechanical fall. Right ankle negative for any signs of acute fracture. No pain with palpation of the fifth metatarsal or navicular therefore no x-ray of foot performed. Provided with ASO and crutches. Patient counseled to follow up with orthopedics. RICE for pain management.   Final Clinical Impressions(s) / ED Diagnoses   Final diagnoses:  None    New Prescriptions New Prescriptions   No medications on file     Karsen Nakanishi Cletis Media, MD 07/04/16 [place], MD 07/04/16 1536

## 2016-07-04 NOTE — ED Provider Notes (Signed)
By signing my name below, I, Soijett Blue, attest that this documentation has been prepared under the direction and in the presence of Ezequiel Essex, MD. Electronically Signed: Soijett Blue, ED Scribe. 07/04/16. 8:33 AM.  I saw and evaluated the patient, reviewed the resident's note and I agree with the findings and plan. If applicable, I agree with the resident's interpretation of the EKG.  If applicable, I was present for critical portions of any procedures performed.   Monica Davis is a 55 y.o. female who presents to the Emergency Department complaining of a fall onset PTA. Pt notes that she fell while ambulating down her porch landing on her right side. Pt denies feeling dizzy or lightheaded prior to the fall. Pt is not UTD with her tetanus vaccination. Pt is having associated symptoms of right ankle pain and right ankle swelling. Pt reports that her right ankle pain is worsened with weight bearing and alleviated with rest. She notes that she has tried ice without medications for the relief of her symptoms. She denies dizziness, lightheadedness, hitting her head, LOC, neck pain, knee pain, hip pain, and any other symptoms.    Physical Exam Skin: Abrasion left elbow, left calf, left thigh.  Musculoskeletal: Large amount of swelling to right lateral malleolus. Intact PT and DP pulses. Achilles intact. No proximal tibial tenderness. No pain at the base of the fifth metatarsal.   Xray negative.  RICE, ASO, crutches.  I personally performed the services described in this documentation, which was scribed in my presence. The recorded information has been reviewed and is accurate.     Ezequiel Essex, MD 07/04/16 1009

## 2016-07-04 NOTE — Discharge Instructions (Signed)
Your x-ray was negative for any acute signs of fracture. To continue to ice your ankle at least 3-4 times a day and keep your leg elevated. You can take ibuprofen every 8 hours as needed for the pain or Tylenol. You should begin to see some improvement over the next 3-4 days. You can follow-up with Dr. Aline Brochure orthopedics later this week.

## 2016-07-04 NOTE — ED Triage Notes (Signed)
Pt fell off of her porch, twisting her right ankle. Swelling noted. Ice applied. Pt has abrasions to left leg, left arm.

## 2016-07-08 ENCOUNTER — Telehealth: Payer: Self-pay | Admitting: Orthopaedic Surgery

## 2016-07-08 ENCOUNTER — Ambulatory Visit (INDEPENDENT_AMBULATORY_CARE_PROVIDER_SITE_OTHER): Payer: BC Managed Care – PPO | Admitting: Orthopaedic Surgery

## 2016-07-08 ENCOUNTER — Encounter: Payer: Self-pay | Admitting: Orthopaedic Surgery

## 2016-07-08 VITALS — BP 130/94 | HR 72 | Temp 97.5°F | Ht 65.0 in | Wt 235.0 lb

## 2016-07-08 DIAGNOSIS — S96911A Strain of unspecified muscle and tendon at ankle and foot level, right foot, initial encounter: Secondary | ICD-10-CM

## 2016-07-08 NOTE — Telephone Encounter (Signed)
ROUTING TO DR KEELING 

## 2016-07-08 NOTE — Telephone Encounter (Signed)
ROUTING TO DR HARRISON 

## 2016-07-08 NOTE — Patient Instructions (Signed)
Contrast Bath  Prepare the baths.  Cold = 55-65 degrees     Hot = 105-110 degrees  Starting with the hot, dip hand or foot all the way into the water and hold there for selected duration.  Preferably 3 minutes.  After selected duration is up, dip hand or foot into the cold for 1/3 duration of the hot. (3 minutes hot, 1 minute cold)  Alternate back and forth for the times indicated for no more than a total of 20 minutes ending with hot.   

## 2016-07-08 NOTE — Telephone Encounter (Signed)
SENDING BACK TO YOU

## 2016-07-08 NOTE — Telephone Encounter (Signed)
Patient called with concern regarding the need to continue use of crutches along with boot, per office visit today, 07/08/16.  States feels very unsafe, due to other medical issues; therefore, asking if a "scooter" or walker, or other recommendation can be made.  Please advise.  Ph#'s [phone] (Home) [phone] [facility]

## 2016-07-08 NOTE — Progress Notes (Signed)
Subjective: I sprained my right ankle    Patient ID: Monica Davis, female    DOB: Jul 17, 1961, 55 y.o.   MRN: [id number]  HPI She fell off her deck on 07-04-16 and hurt her right ankle.  She was seen in the ER and had x-rays.  She was told no fracture and given crutches and brace.  She has swelling laterally.  She has taken pain medicine and stayed off it.  It still hurts.  She has a bruise of the left lower leg but no other injury.  She is using her crutches well.   Review of Systems  HENT: Negative for congestion.   Respiratory: Negative for cough and shortness of breath.   Cardiovascular: Negative for chest pain and leg swelling.  Endocrine: Positive for cold intolerance.  Musculoskeletal: Positive for arthralgias, gait problem and joint swelling.  Allergic/Immunologic: Positive for environmental allergies.  Psychiatric/Behavioral: The patient is nervous/anxious.    Past Medical History:  Diagnosis Date  . Anxiety   . Arthritis   . Depression   . Fibromyalgia   . GERD (gastroesophageal reflux disease)   . Insomnia   . OSA (obstructive sleep apnea) 06/25/2016  . Vaginal dryness 05/22/2014    Past Surgical History:  Procedure Laterality Date  . ABDOMINAL HYSTERECTOMY    . CHOLECYSTECTOMY    . COLONOSCOPY N/A 05/03/2015   Procedure: COLONOSCOPY;  Surgeon: Rogene Houston, MD;  Location: [facility];  Service: Endoscopy;  Laterality: N/A;  830  . HEMORRHOID SURGERY      Current Outpatient Prescriptions on File Prior to Visit  Medication Sig Dispense Refill  . ALPRAZolam (XANAX) 0.5 MG tablet Take 0.5 mg by mouth at bedtime as needed for anxiety.    Marland Kitchen aspirin 81 MG tablet Take 81 mg by mouth daily.    Marland Kitchen atorvastatin (LIPITOR) 20 MG tablet     . cyclobenzaprine (FLEXERIL) 10 MG tablet Take 10 mg by mouth 3 (three) times daily as needed for muscle spasms.    Marland Kitchen docusate sodium (COLACE) 100 MG capsule Take 100 mg by mouth 2 (two) times daily.    . DULoxetine (CYMBALTA) 30 MG  capsule     . DULoxetine (CYMBALTA) 60 MG capsule Take 60 mg by mouth daily.    . Gabapentin, Once-Daily, (GRALISE) 300 MG TABS One capsule daily with supper for 2 weeks, then take 2 capsules daily 60 tablet 2  . HYDROcodone-acetaminophen (NORCO/VICODIN) 5-325 MG tablet Take 1 tablet by mouth every 6 (six) hours as needed for moderate pain.    . Multiple Vitamins-Minerals (CENTRUM SILVER PO) Take by mouth. One tablet daily    . naproxen sodium (ALEVE) 220 MG tablet Take 220 mg by mouth 2 (two) times daily with a meal.    . nystatin cream (MYCOSTATIN)     . nystatin-triamcinolone (MYCOLOG II) cream APPLY TOPICALLY TO AFFECTED AREA(S) TWO TO THREE TIMES DAILY AS NEEDED 30 g 6  . Omega-3 Fatty Acids (FISH OIL) 1000 MG CAPS Take by mouth. Reported on 01/17/2016    . omeprazole (PRILOSEC) 20 MG capsule Take 20 mg by mouth daily.    . QUEtiapine (SEROQUEL) 25 MG tablet      No current facility-administered medications on file prior to visit.     Social History   Social History  . Marital status: Married    Spouse name: N/A  . Number of children: 2  . Years of education: Some college   Occupational History  . FMLA  Social History Main Topics  . Smoking status: Never Smoker  . Smokeless tobacco: Never Used  . Alcohol use 0.0 oz/week     Comment: occ  Margarita 1-2 month  . Drug use: No  . Sexual activity: Yes    Birth control/ protection: Surgical   Other Topics Concern  . Not on file   Social History Narrative   Lives at home with husband.  HS grad.  Works at [employer]  2 daughters.   Drinks 3-4 cups coffee or tea daily.    Family History  Problem Relation Age of Onset  . Diabetes Mother   . Fibromyalgia Mother   . Anxiety disorder Mother   . OCD Mother   . Asthma Mother   . Hypertension Mother     Brother, benign essential  . COPD Father   . Asthma Father   . Pancreatitis Brother   . Diabetes Brother   . Alzheimer's disease Maternal Aunt   . Cancer  Maternal Aunt     breast  . Alzheimer's disease Paternal Grandmother   . Cancer Other     breast,lung; maternal aunt  . Asthma Brother     BP (!) 130/94   Pulse 72   Temp 97.5 F (36.4 C)   Ht 5\' 5"  (1.651 m)   Wt 235 lb (106.6 kg)   BMI 39.11 kg/m      Objective:   Physical Exam  Constitutional: She is oriented to person, place, and time. She appears well-developed and well-nourished.  HENT:  Head: Normocephalic and atraumatic.  Eyes: Conjunctivae and EOM are normal. Pupils are equal, round, and reactive to light.  Neck: Normal range of motion. Neck supple.  Cardiovascular: Normal rate, regular rhythm and intact distal pulses.   Pulmonary/Chest: Effort normal.  Abdominal: Soft.  Musculoskeletal: She exhibits tenderness (swelling laterally of the right ankle, decreaed motion, NV intact.  Resolving ecchymosis laterally.  Left ankle negative.  On crutches.).  Neurological: She is alert and oriented to person, place, and time. She displays normal reflexes. No cranial nerve deficit. She exhibits normal muscle tone. Coordination normal.  Skin: Skin is warm and dry.  Psychiatric: She has a normal mood and affect. Her behavior is normal. Judgment and thought content normal.          Assessment & Plan:   Encounter Diagnosis  Name Primary?  Marland Kitchen Ankle strain, right, initial encounter Yes   Begin contrast baths.  She was given a CAM walker  Continue crutches.  Call if any problem.  Return in two weeks.  Precautions discussed.  Electronically Signed Sanjuana Kava, MD 9/26/201711:27 AM

## 2016-07-09 NOTE — Telephone Encounter (Signed)
I can give Rx for Walker.  Scooter will not be covered by insurance usually and not that easy to transport out of the house.

## 2016-07-09 NOTE — Telephone Encounter (Signed)
PATIENT STATES SHE HAS A WALKER THAT BELONGED TO A FAMILY MEMBER AND SHE WILL USE THAT

## 2016-07-14 ENCOUNTER — Ambulatory Visit: Payer: Self-pay | Admitting: Neurology

## 2016-07-24 ENCOUNTER — Ambulatory Visit (INDEPENDENT_AMBULATORY_CARE_PROVIDER_SITE_OTHER): Payer: BC Managed Care – PPO | Admitting: Orthopaedic Surgery

## 2016-07-24 VITALS — BP 108/72 | HR 98 | Temp 97.7°F | Ht 65.0 in | Wt 237.0 lb

## 2016-07-24 DIAGNOSIS — S96911D Strain of unspecified muscle and tendon at ankle and foot level, right foot, subsequent encounter: Secondary | ICD-10-CM | POA: Diagnosis not present

## 2016-07-24 NOTE — Progress Notes (Signed)
Patient [name] Monica Davis, female DOB:Aug 17, 1961, 55 y.o. [id number]  Chief Complaint  Patient presents with  . Follow-up    right ankle strain    HPI  Monica Davis is a 55 y.o. female who has a resolving ankle strain on the right.  She has lateral swelling still but less. She has less ecchymosis.  She has no new trauma.  She has been using her CAM walker but has stopped.  She has a small hematoma on the left lower leg as well.  She is better. HPI  Body mass index is 39.44 kg/m.  ROS  Review of Systems  HENT: Negative for congestion.   Respiratory: Negative for cough and shortness of breath.   Cardiovascular: Negative for chest pain and leg swelling.  Endocrine: Positive for cold intolerance.  Musculoskeletal: Positive for arthralgias, gait problem and joint swelling.  Allergic/Immunologic: Positive for environmental allergies.  Psychiatric/Behavioral: The patient is nervous/anxious.     Past Medical History:  Diagnosis Date  . Anxiety   . Arthritis   . Depression   . Fibromyalgia   . GERD (gastroesophageal reflux disease)   . Insomnia   . OSA (obstructive sleep apnea) 06/25/2016  . Vaginal dryness 05/22/2014    Past Surgical History:  Procedure Laterality Date  . ABDOMINAL HYSTERECTOMY    . CHOLECYSTECTOMY    . COLONOSCOPY N/A 05/03/2015   Procedure: COLONOSCOPY;  Surgeon: Rogene Houston, MD;  Location: [facility];  Service: Endoscopy;  Laterality: N/A;  830  . HEMORRHOID SURGERY      Family History  Problem Relation Age of Onset  . Diabetes Mother   . Fibromyalgia Mother   . Anxiety disorder Mother   . OCD Mother   . Asthma Mother   . Hypertension Mother     Brother, benign essential  . COPD Father   . Asthma Father   . Pancreatitis Brother   . Diabetes Brother   . Alzheimer's disease Maternal Aunt   . Cancer Maternal Aunt     breast  . Alzheimer's disease Paternal Grandmother   . Cancer Other     breast,lung; maternal aunt  . Asthma  Brother     Social History Social History  Substance Use Topics  . Smoking status: Never Smoker  . Smokeless tobacco: Never Used  . Alcohol use 0.0 oz/week     Comment: occ  Margarita 1-2 month    No Known Allergies  Current Outpatient Prescriptions  Medication Sig Dispense Refill  . ALPRAZolam (XANAX) 0.5 MG tablet Take 0.5 mg by mouth at bedtime as needed for anxiety.    Marland Kitchen aspirin 81 MG tablet Take 81 mg by mouth daily.    Marland Kitchen atorvastatin (LIPITOR) 20 MG tablet     . cyclobenzaprine (FLEXERIL) 10 MG tablet Take 10 mg by mouth 3 (three) times daily as needed for muscle spasms.    Marland Kitchen docusate sodium (COLACE) 100 MG capsule Take 100 mg by mouth 2 (two) times daily.    . DULoxetine (CYMBALTA) 30 MG capsule     . DULoxetine (CYMBALTA) 60 MG capsule Take 60 mg by mouth daily.    . Gabapentin, Once-Daily, (GRALISE) 300 MG TABS One capsule daily with supper for 2 weeks, then take 2 capsules daily 60 tablet 2  . HORIZANT 300 MG TBCR     . HYDROcodone-acetaminophen (NORCO/VICODIN) 5-325 MG tablet Take 1 tablet by mouth every 6 (six) hours as needed for moderate pain.    . Multiple Vitamins-Minerals (CENTRUM SILVER  PO) Take by mouth. One tablet daily    . naproxen sodium (ALEVE) 220 MG tablet Take 220 mg by mouth 2 (two) times daily with a meal.    . nystatin cream (MYCOSTATIN)     . nystatin-triamcinolone (MYCOLOG II) cream APPLY TOPICALLY TO AFFECTED AREA(S) TWO TO THREE TIMES DAILY AS NEEDED 30 g 6  . Omega-3 Fatty Acids (FISH OIL) 1000 MG CAPS Take by mouth. Reported on 01/17/2016    . omeprazole (PRILOSEC) 20 MG capsule Take 20 mg by mouth daily.    . QUEtiapine (SEROQUEL) 25 MG tablet      No current facility-administered medications for this visit.      Physical Exam  Blood pressure 108/72, pulse 98, temperature 97.7 F (36.5 C), height 5\' 5"  (1.651 m), weight 237 lb (107.5 kg).  Constitutional: overall normal hygiene, normal nutrition, well developed, normal grooming, normal  body habitus. Assistive device:none  Musculoskeletal: gait and station Limp right, muscle tone and strength are normal, no tremors or atrophy is present.  .  Neurological: coordination overall normal.  Deep tendon reflex/nerve stretch intact.  Sensation normal.  Cranial nerves II-XII intact.   Skin:   Normal overall no scars, lesions, ulcers or rashes. No psoriasis.  Psychiatric: Alert and oriented x 3.  Recent memory intact, remote memory unclear.  Normal mood and affect. Well groomed.  Good eye contact.  Cardiovascular: overall no swelling, no varicosities, no edema bilaterally, normal temperatures of the legs and arms, no clubbing, cyanosis and good capillary refill.  Lymphatic: palpation is normal.  The right ankle has swelling more laterally.  There is resolving ecchymosis.  ROM is full.  There is a slight right limp.  NV intact.  She has a small hematoma of the left lower leg anterior and proximal.  I told her to use mild heat to this area.  The patient has been educated about the nature of the problem(s) and counseled on treatment options.  The patient appeared to understand what I have discussed and is in agreement with it.  Encounter Diagnosis  Name Primary?  Marland Kitchen Ankle strain, right, subsequent encounter Yes    PLAN Call if any problems.  Precautions discussed.  Continue current medications.   Return to clinic 3 weeks   Electronically Signed Sanjuana Kava, MD 10/12/20178:28 AM

## 2016-08-19 ENCOUNTER — Ambulatory Visit: Payer: Self-pay | Admitting: Orthopaedic Surgery

## 2016-09-17 ENCOUNTER — Telehealth: Payer: Self-pay | Admitting: Neurology

## 2016-09-17 MED ORDER — GABAPENTIN (ONCE-DAILY) 600 MG PO TABS: 600.0000 mg | ORAL_TABLET | Freq: Every day | ORAL | 3 refills | Status: DC

## 2016-09-17 MED ORDER — GABAPENTIN (ONCE-DAILY) 300 MG PO TABS: 2.0000 | ORAL_TABLET | Freq: Every day | ORAL | 11 refills | Status: DC

## 2016-09-17 NOTE — Telephone Encounter (Signed)
[facility] [phone] called request refill for Gabapentin, Once-Daily, (GRALISE) 300 MG TABS .

## 2016-09-17 NOTE — Telephone Encounter (Signed)
Refills e-scribed as requested. 

## 2016-09-17 NOTE — Addendum Note (Signed)
Addended by: Monte Fantasia on: 09/17/2016 02:58 PM   Modules accepted: Orders

## 2016-09-17 NOTE — Telephone Encounter (Signed)
Spoke to pharmacy. Pt has been tolerating 2 of the 300 mg tabs daily. Rx changed to 600 mg daily per Dr. Jannifer Franklin' previous note.

## 2016-09-17 NOTE — Telephone Encounter (Signed)
[facility] [phone] called requesting clarification on mg and directions

## 2016-12-23 ENCOUNTER — Ambulatory Visit: Payer: BC Managed Care – PPO | Admitting: Adult Health

## 2017-01-22 DIAGNOSIS — Z0289 Encounter for other administrative examinations: Secondary | ICD-10-CM

## 2017-01-29 ENCOUNTER — Ambulatory Visit (INDEPENDENT_AMBULATORY_CARE_PROVIDER_SITE_OTHER): Payer: BC Managed Care – PPO | Admitting: Adult Health

## 2017-01-29 ENCOUNTER — Encounter: Payer: Self-pay | Admitting: Adult Health

## 2017-01-29 ENCOUNTER — Encounter (INDEPENDENT_AMBULATORY_CARE_PROVIDER_SITE_OTHER): Payer: Self-pay

## 2017-01-29 VITALS — BP 126/76 | HR 90 | Ht 66.0 in | Wt 242.2 lb

## 2017-01-29 DIAGNOSIS — M797 Fibromyalgia: Secondary | ICD-10-CM | POA: Diagnosis not present

## 2017-01-29 NOTE — Progress Notes (Signed)
PATIENT: Monica Davis DOB: 06-21-1961  REASON FOR VISIT: follow up- fibromyalgia HISTORY FROM: patient and friend  HISTORY OF PRESENT ILLNESS: Monica Davis is a 56 year old female with a history of fibromyalgia. She returns today for follow-up. She was placed on gralise at the last visit. She reports that she has not really seen any change in her symptoms since starting this medication. She states that she continues to have stiffness in the muscles and joints. She is not exercising or stretching on a daily basis. 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 She states that her brother recently committed suicide she she has been dealing with the grief related to this. She is seeing a Social worker at this time. She returns today for an evaluation.  HISTORY 06/25/16: Monica Davis is a 56 year old right-handed white female with a history of long-standing fibromyalgia and a history of obesity. The patient has recently undergone a sleep study that confirmed the presence of sleep apnea, but the patient currently is not working, she does not believe that she can afford to initiate CPAP treatment. The patient continues to have significant issues with depression, she will be seeing a psychiatrist in the near future. The patient is relatively inactive. She reports a lot of arthralgias in the hands and legs that seems to worsen with cooler weather. The patient remains on Cymbalta at 90 mg daily, she could not tolerate the 120 mg dose. The patient returns to the office today for an evaluation.  REVIEW OF SYSTEMS: Out of a complete 14 system review of symptoms, the patient complains only of the following symptoms, and all other reviewed systems are negative.  Constipation, diarrhea, nausea, restless leg, insomnia, apnea, frequent waking, daytime sleepiness, snoring, joint pain, joint swelling, back pain, aching muscles, muscle  cramps, walking difficulty, moles, itching, agitation, confusion, depression, nervous/anxious, memory loss, cold intolerance, heat intolerance, light sensitivity, chest tightness  ALLERGIES: No Known Allergies  HOME MEDICATIONS: Outpatient Medications Prior to Visit  Medication Sig Dispense Refill  . ALPRAZolam (XANAX) 0.5 MG tablet Take 0.5 mg by mouth at bedtime as needed for anxiety.    Marland Kitchen aspirin 81 MG tablet Take 81 mg by mouth daily.    Marland Kitchen atorvastatin (LIPITOR) 20 MG tablet     . cyclobenzaprine (FLEXERIL) 10 MG tablet Take 10 mg by mouth 3 (three) times daily as needed for muscle spasms.    Marland Kitchen docusate sodium (COLACE) 100 MG capsule Take 100 mg by mouth 2 (two) times daily.    . DULoxetine (CYMBALTA) 30 MG capsule     . DULoxetine (CYMBALTA) 60 MG capsule Take 60 mg by mouth daily.    . Gabapentin, Once-Daily, 600 MG TABS Take 600 mg by mouth daily. 90 tablet 3  . HORIZANT 300 MG TBCR     . HYDROcodone-acetaminophen (NORCO/VICODIN) 5-325 MG tablet Take 1 tablet by mouth every 6 (six) hours as needed for moderate pain.    . Multiple Vitamins-Minerals (CENTRUM SILVER PO) Take by mouth. One tablet daily    . naproxen sodium (ALEVE) 220 MG tablet Take 220 mg by mouth 2 (two) times daily with a meal.    . nystatin cream (MYCOSTATIN)     . nystatin-triamcinolone (MYCOLOG II) cream APPLY TOPICALLY TO AFFECTED AREA(S) TWO TO THREE TIMES DAILY AS NEEDED 30 g 6  . Omega-3 Fatty Acids (FISH OIL) 1000 MG CAPS Take by mouth. Reported  on 01/17/2016    . omeprazole (PRILOSEC) 20 MG capsule Take 20 mg by mouth daily.    . QUEtiapine (SEROQUEL) 25 MG tablet      No facility-administered medications prior to visit.     PAST MEDICAL HISTORY: Past Medical History:  Diagnosis Date  . Anxiety   . Arthritis   . Depression   . Fibromyalgia   . GERD (gastroesophageal reflux disease)   . Insomnia   . OSA (obstructive sleep apnea) 06/25/2016  . Vaginal dryness 05/22/2014    PAST SURGICAL  HISTORY: Past Surgical History:  Procedure Laterality Date  . ABDOMINAL HYSTERECTOMY    . CHOLECYSTECTOMY    . COLONOSCOPY N/A 05/03/2015   Procedure: COLONOSCOPY;  Surgeon: Rogene Houston, MD;  Location: [facility];  Service: Endoscopy;  Laterality: N/A;  830  . HEMORRHOID SURGERY      FAMILY HISTORY: Family History  Problem Relation Age of Onset  . Diabetes Mother   . Fibromyalgia Mother   . Anxiety disorder Mother   . OCD Mother   . Asthma Mother   . Hypertension Mother     Brother, benign essential  . COPD Father   . Asthma Father   . Pancreatitis Brother   . Diabetes Brother   . Alzheimer's disease Maternal Aunt   . Cancer Maternal Aunt     breast  . Alzheimer's disease Paternal Grandmother   . Cancer Other     breast,lung; maternal aunt  . Asthma Brother     SOCIAL HISTORY: Social History   Social History  . Marital status: Married    Spouse name: N/A  . Number of children: 2  . Years of education: Some college   Occupational History  . FMLA    Social History Main Topics  . Smoking status: Never Smoker  . Smokeless tobacco: Never Used  . Alcohol use 0.0 oz/week     Comment: occ  Margarita 1-2 month  . Drug use: No  . Sexual activity: Yes    Birth control/ protection: Surgical   Other Topics Concern  . Not on file   Social History Narrative   Lives at home with husband.  HS grad.  Works at [employer]  2 daughters.   Drinks 3-4 cups coffee or tea daily.      PHYSICAL EXAM  Vitals:   01/29/17 0943  BP: 126/76  Pulse: 90  Weight: 242 lb 3.2 oz (109.9 kg)  Height: 5\' 6"  (1.676 m)   Body mass index is 39.09 kg/m.  Generalized: Well developed, in no acute distress   Neurological examination  Mentation: Alert oriented to time, place, history taking. Follows all commands speech and language fluent Cranial nerve II-XII: Pupils were equal round reactive to light. Extraocular movements were full, visual field were full on  confrontational test. Facial sensation and strength were normal. Uvula tongue midline. Head turning and shoulder shrug  were normal and symmetric. Motor: The motor testing reveals 5 over 5 strength of all 4 extremities. Good symmetric motor tone is noted throughout.  Sensory: Sensory testing is intact to soft touch on all 4 extremities. No evidence of extinction is noted.  Coordination: Cerebellar testing reveals good finger-nose-finger and heel-to-shin bilaterally.  Gait and station: Gait is normal. Tandem gait is normal. Romberg is negative. No drift is seen.  Reflexes: Deep tendon reflexes are symmetric and normal bilaterally.   DIAGNOSTIC DATA (LABS, IMAGING, TESTING) - I reviewed patient records, labs, notes, testing and imaging myself  where available.   ASSESSMENT AND PLAN 56 y.o. year old female  has a past medical history of Anxiety; Arthritis; Depression; Fibromyalgia; GERD (gastroesophageal reflux disease); Insomnia; OSA (obstructive sleep apnea) (06/25/2016); and Vaginal dryness (05/22/2014). here with :  1. Fibromyalgia  Patient will continue on gralise at this time. I advised patient that exercising daily will be the most beneficial for her fibromyalgia symptoms. Advised doing things such as yoga or Pilates would be beneficial. She is in agreement. For now we will not adjust any of her medication. She is instructed to participate in yoga or Pilates daily. She will follow-up in 6 months or sooner if needed.  I spent 15 minutes with the patient 50% of this time was spent discussing treatment options for fibromyalgia.   Ward Givens, MSN, NP-C 01/29/2017, 9:39 AM [facility] [address] [place], [place] [address] [phone]

## 2017-01-29 NOTE — Progress Notes (Signed)
I have read the note, and I agree with the clinical assessment and plan.  WILLIS,CHARLES KEITH   

## 2017-01-29 NOTE — Patient Instructions (Signed)
Try Yoga/piliates daily  Continue gralise for now If your symptoms worsen or you develop new symptoms please let us know.

## 2017-02-24 ENCOUNTER — Ambulatory Visit (INDEPENDENT_AMBULATORY_CARE_PROVIDER_SITE_OTHER): Payer: BC Managed Care – PPO

## 2017-02-24 ENCOUNTER — Ambulatory Visit (INDEPENDENT_AMBULATORY_CARE_PROVIDER_SITE_OTHER): Payer: BC Managed Care – PPO | Admitting: Orthopaedic Surgery

## 2017-02-24 ENCOUNTER — Encounter: Payer: Self-pay | Admitting: Orthopaedic Surgery

## 2017-02-24 VITALS — BP 139/80 | HR 95 | Temp 97.9°F | Ht 66.0 in | Wt 239.0 lb

## 2017-02-24 DIAGNOSIS — G8929 Other chronic pain: Secondary | ICD-10-CM

## 2017-02-24 DIAGNOSIS — M25562 Pain in left knee: Secondary | ICD-10-CM

## 2017-02-24 DIAGNOSIS — M25552 Pain in left hip: Secondary | ICD-10-CM | POA: Diagnosis not present

## 2017-02-24 MED ORDER — PREDNISONE 5 MG (21) PO TBPK: ORAL_TABLET | ORAL | 0 refills | Status: DC

## 2017-02-24 NOTE — Progress Notes (Signed)
Patient VQ:[id number] Monica Davis, female DOB:Dec 02, 1960, 56 y.o. VFI:[id number]  Chief Complaint  Patient presents with  . New Problem    Left hip and knee pain    HPI  Monica Davis is a 56 y.o. female who has left hip and left knee pain for several months.  The knee is bothering her more. She has no trauma, no redness, no paresthesias. She has no giving way.  She has swelling at times and popping.    Her left hip is tender over the trochanter at times but more a deep pain.  She has had swelling of the lateral thigh at times.  She is not taking any NSAID.  She has tried heat and ice and rest with slight help. HPI  Body mass index is 38.58 kg/m.  ROS  Review of Systems  HENT: Negative for congestion.   Respiratory: Negative for cough and shortness of breath.   Cardiovascular: Negative for chest pain and leg swelling.  Endocrine: Positive for cold intolerance.  Musculoskeletal: Positive for arthralgias, gait problem and joint swelling.  Allergic/Immunologic: Positive for environmental allergies.  Psychiatric/Behavioral: The patient is nervous/anxious.     Past Medical History:  Diagnosis Date  . Anxiety   . Arthritis   . Depression   . Fibromyalgia   . GERD (gastroesophageal reflux disease)   . Insomnia   . OSA (obstructive sleep apnea) 06/25/2016  . Vaginal dryness 05/22/2014    Past Surgical History:  Procedure Laterality Date  . ABDOMINAL HYSTERECTOMY    . CHOLECYSTECTOMY    . COLONOSCOPY N/A 05/03/2015   Procedure: COLONOSCOPY;  Surgeon: Rogene Houston, MD;  Location: [facility];  Service: Endoscopy;  Laterality: N/A;  830  . HEMORRHOID SURGERY      Family History  Problem Relation Age of Onset  . Diabetes Mother   . Fibromyalgia Mother   . Anxiety disorder Mother   . OCD Mother   . Asthma Mother   . Hypertension Mother        Brother, benign essential  . COPD Father   . Asthma Father   . Pancreatitis Brother   . Diabetes Brother   . Alzheimer's  disease Maternal Aunt   . Cancer Maternal Aunt        breast  . Alzheimer's disease Paternal Grandmother   . Cancer Other        breast,lung; maternal aunt  . Asthma Brother     Social History Social History  Substance Use Topics  . Smoking status: Never Smoker  . Smokeless tobacco: Never Used  . Alcohol use 0.0 oz/week     Comment: occ  Margarita 1-2 month    No Known Allergies  Current Outpatient Prescriptions  Medication Sig Dispense Refill  . ALPRAZolam (XANAX) 0.5 MG tablet Take 0.5 mg by mouth at bedtime as needed for anxiety.    Marland Kitchen aspirin 81 MG tablet Take 81 mg by mouth daily.    Marland Kitchen atorvastatin (LIPITOR) 20 MG tablet     . cyclobenzaprine (FLEXERIL) 10 MG tablet Take 10 mg by mouth 3 (three) times daily as needed for muscle spasms.    Marland Kitchen docusate sodium (COLACE) 100 MG capsule Take 100 mg by mouth 2 (two) times daily.    . DULoxetine (CYMBALTA) 30 MG capsule     . DULoxetine (CYMBALTA) 60 MG capsule Take 60 mg by mouth daily.    Marland Kitchen GRALISE 600 MG TABS     . HYDROcodone-acetaminophen (NORCO/VICODIN) 5-325 MG tablet Take 1  tablet by mouth every 6 (six) hours as needed for moderate pain.    . Multiple Vitamins-Minerals (CENTRUM SILVER PO) Take by mouth. One tablet daily    . naproxen sodium (ALEVE) 220 MG tablet Take 220 mg by mouth 2 (two) times daily with a meal.    . nystatin cream (MYCOSTATIN)     . nystatin-triamcinolone (MYCOLOG II) cream APPLY TOPICALLY TO AFFECTED AREA(S) TWO TO THREE TIMES DAILY AS NEEDED 30 g 6  . Omega-3 Fatty Acids (FISH OIL) 1000 MG CAPS Take by mouth. Reported on 01/17/2016    . omeprazole (PRILOSEC) 20 MG capsule Take 20 mg by mouth daily.    . predniSONE (STERAPRED UNI-PAK 21 TAB) 5 MG (21) TBPK tablet Take 6 pills first day; 5 pills second day; 4 pills third day; 3 pills fourth day; 2 pills next day and 1 pill last day. 21 tablet 0  . QUEtiapine (SEROQUEL) 25 MG tablet      No current facility-administered medications for this visit.       Physical Exam  Blood pressure 139/80, pulse 95, temperature 97.9 F (36.6 C), height 5\' 6"  (1.676 m), weight 239 lb (108.4 kg).  Constitutional: overall normal hygiene, normal nutrition, well developed, normal grooming, normal body habitus. Assistive device:none  Musculoskeletal: gait and station Limp left, muscle tone and strength are normal, no tremors or atrophy is present.  .  Neurological: coordination overall normal.  Deep tendon reflex/nerve stretch intact.  Sensation normal.  Cranial nerves II-XII intact.   Skin:   Normal overall no scars, lesions, ulcers or rashes. No psoriasis.  Psychiatric: Alert and oriented x 3.  Recent memory intact, remote memory unclear.  Normal mood and affect. Well groomed.  Good eye contact.  Cardiovascular: overall no swelling, no varicosities, no edema bilaterally, normal temperatures of the legs and arms, no clubbing, cyanosis and good capillary refill.  Lymphatic: palpation is normal.  She has full motion of the left hip.  NV intact. She has only slight tenderness over the lateral trochanteric area.  She has no redness.  The left lower extremity is examined:  Inspection:  Thigh:  Non-tender and no defects  Knee has swelling 1/2+ effusion.                        Joint tenderness is present                        Patient is tender over the medial joint line  Lower Leg:  Has normal appearance and no tenderness or defects  Ankle:  Non-tender and no defects  Foot:  Non-tender and no defects Range of Motion:  Knee:  Range of motion is: 0-105                        Crepitus is  present  Ankle:  Range of motion is normal. Strength and Tone:  The left lower extremity has normal strength and tone. Stability:  Knee:  The knee is stable.  Ankle:  The ankle is stable.  X-rays were done of the left hip and left knee, reported separately.  The patient has been educated about the nature of the problem(s) and counseled on treatment options.   The patient appeared to understand what I have discussed and is in agreement with it.  Encounter Diagnoses  Name Primary?  . Chronic pain of left knee Yes  . Chronic left hip  pain     PLAN Call if any problems.  Precautions discussed.  Continue current medications.   Return to clinic 2 weeks   Begin prednisone dose pack.  After it is completed, begin Aleve one bid pc.  Precautions discussed.  Electronically Signed Sanjuana Kava, MD 5/15/201810:07 AM

## 2017-03-10 ENCOUNTER — Ambulatory Visit: Payer: Self-pay | Admitting: Orthopaedic Surgery

## 2017-03-17 ENCOUNTER — Ambulatory Visit: Payer: Self-pay | Admitting: Orthopaedic Surgery

## 2017-03-27 ENCOUNTER — Other Ambulatory Visit: Payer: Self-pay | Admitting: Adult Health

## 2017-03-27 DIAGNOSIS — Z1231 Encounter for screening mammogram for malignant neoplasm of breast: Secondary | ICD-10-CM

## 2017-05-11 ENCOUNTER — Ambulatory Visit (HOSPITAL_COMMUNITY)
Admission: RE | Admit: 2017-05-11 | Discharge: 2017-05-11 | Disposition: A | Discharge status: Home / Self Care | Payer: BC Managed Care – PPO | Source: Ambulatory Visit | Attending: Adult Health | Admitting: Adult Health

## 2017-05-11 DIAGNOSIS — Z1231 Encounter for screening mammogram for malignant neoplasm of breast: Secondary | ICD-10-CM | POA: Diagnosis not present

## 2017-08-03 ENCOUNTER — Ambulatory Visit: Payer: BC Managed Care – PPO | Admitting: Adult Health

## 2017-08-03 ENCOUNTER — Telehealth: Payer: Self-pay

## 2017-08-03 NOTE — Telephone Encounter (Signed)
Pt no-showed today's f/u appt.

## 2017-08-04 ENCOUNTER — Encounter: Payer: Self-pay | Admitting: Adult Health

## 2017-08-18 ENCOUNTER — Ambulatory Visit: Payer: BC Managed Care – PPO | Admitting: Adult Health

## 2017-08-20 ENCOUNTER — Encounter: Payer: Self-pay | Admitting: Adult Health

## 2017-08-20 ENCOUNTER — Ambulatory Visit: Payer: BC Managed Care – PPO | Admitting: Adult Health

## 2017-08-20 VITALS — BP 143/81 | HR 87

## 2017-08-20 DIAGNOSIS — M797 Fibromyalgia: Secondary | ICD-10-CM | POA: Diagnosis not present

## 2017-08-20 NOTE — Progress Notes (Signed)
PATIENT: Monica Davis DOB: Mar 04, 1961  REASON FOR VISIT: follow up-fibromyalgia HISTORY FROM: patient  HISTORY OF PRESENT ILLNESS: Today 08/20/17 Ms. Monica Davis is a 56 year old female with a history of fibromyalgia.  She returns today for follow-up.  She remains on voice Cymbalta, hydrocodone and Nucynta.  She states that she has a significant amount of joint pain especially in cold weather.  She reports some mild changes with her gait or balance.  She finds that she has a hard time going down steps.  She denies any recent falls.  She states today she is having a lot of pain in her knees.  She returns today for an evaluation.  HISTORY 01/29/17:Ms. Monica Davis is a 56 year old female with a history of fibromyalgia. She returns today for follow-up. She was placed on gralise at the last visit. She reports that she has not really seen any change in her symptoms since starting this medication. She states that she continues to have stiffness in the muscles and joints. She is not exercising or stretching on a daily basis. 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 She states that her brother recently committed suicide she she has been dealing with the grief related to this. She is seeing a Social worker at this time. She returns today for an evaluation.    REVIEW OF SYSTEMS: Out of a complete 14 system review of symptoms, the patient complains only of the following symptoms, and all other reviewed systems are negative.  Fatigue, excessive sweating, eye itching, light sensitivity, shortness of breath, chest tightness, palpitations, cold intolerance, heat intolerance, constipation, diarrhea, nausea, incontinence of bowels, restless leg, apnea, frequent waking, joint pain, joint swelling, back pain, aching muscles, muscle cramps, walking difficulty, neck pain, neck stiffness, itching, memory loss, dizziness, headache, speech  difficulty, agitation, confusion, decreased concentration, depression, nervous/anxious  ALLERGIES: No Known Allergies  HOME MEDICATIONS: Outpatient Medications Prior to Visit  Medication Sig Dispense Refill  . ALPRAZolam (XANAX) 0.5 MG tablet Take 0.5 mg by mouth at bedtime as needed for anxiety.    Monica Davis atorvastatin (LIPITOR) 20 MG tablet     . cyclobenzaprine (FLEXERIL) 10 MG tablet Take 10 mg by mouth 3 (three) times daily as needed for muscle spasms.    Monica Davis docusate sodium (COLACE) 100 MG capsule Take 100 mg by mouth 2 (two) times daily.    . DULoxetine (CYMBALTA) 30 MG capsule     . DULoxetine (CYMBALTA) 60 MG capsule Take 60 mg by mouth daily.    Monica Davis GRALISE 600 MG TABS     . HYDROcodone-acetaminophen (NORCO/VICODIN) 5-325 MG tablet Take 1 tablet by mouth every 6 (six) hours as needed for moderate pain.    . Multiple Vitamins-Minerals (CENTRUM SILVER PO) Take by mouth. One tablet daily    . naproxen sodium (ALEVE) 220 MG tablet Take 220 mg by mouth 2 (two) times daily with a meal.    . NUCYNTA 50 MG tablet Take 50 mg 3 (three) times daily as needed by mouth. for pain  0  . omeprazole (PRILOSEC) 20 MG capsule Take 20 mg by mouth daily.    . QUEtiapine (SEROQUEL) 25 MG tablet     . tiZANidine (ZANAFLEX) 4 MG tablet TAKE 1 TABLET FOUR TIMES DAILY AS NEEDED FOR MUSCLE SPASMS  0  . aspirin 81 MG tablet Take 81 mg by mouth daily.    Monica Davis nystatin cream (MYCOSTATIN)     .  Omega-3 Fatty Acids (FISH OIL) 1000 MG CAPS Take by mouth. Reported on 01/17/2016    . nystatin-triamcinolone (MYCOLOG II) cream APPLY TOPICALLY TO AFFECTED AREA(S) TWO TO THREE TIMES DAILY AS NEEDED 30 g 6  . predniSONE (STERAPRED UNI-PAK 21 TAB) 5 MG (21) TBPK tablet Take 6 pills first day; 5 pills second day; 4 pills third day; 3 pills fourth day; 2 pills next day and 1 pill last day. 21 tablet 0   No facility-administered medications prior to visit.     PAST MEDICAL HISTORY: Past Medical History:  Diagnosis Date  . Anxiety    . Arthritis   . Depression   . Fibromyalgia   . GERD (gastroesophageal reflux disease)   . Insomnia   . OSA (obstructive sleep apnea) 06/25/2016  . Vaginal dryness 05/22/2014    PAST SURGICAL HISTORY: Past Surgical History:  Procedure Laterality Date  . ABDOMINAL HYSTERECTOMY    . CHOLECYSTECTOMY    . HEMORRHOID SURGERY      FAMILY HISTORY: Family History  Problem Relation Age of Onset  . Diabetes Mother   . Fibromyalgia Mother   . Anxiety disorder Mother   . OCD Mother   . Asthma Mother   . Hypertension Mother        Brother, benign essential  . COPD Father   . Asthma Father   . Pancreatitis Brother   . Diabetes Brother   . Asthma Brother   . Alzheimer's disease Maternal Aunt   . Cancer Maternal Aunt        breast  . Alzheimer's disease Paternal Grandmother   . Cancer Other        breast,lung; maternal aunt    SOCIAL HISTORY: Social History   Socioeconomic History  . Marital status: Married    Spouse name: Not on file  . Number of children: 2  . Years of education: Some college  . Highest education level: Not on file  Social Needs  . Financial resource strain: Not on file  . Food insecurity - worry: Not on file  . Food insecurity - inability: Not on file  . Transportation needs - medical: Not on file  . Transportation needs - non-medical: Not on file  Occupational History  . 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 Smoking status: Never Smoker  . Smokeless tobacco: Never Used  Substance and Sexual Activity  . Alcohol use: Yes    Alcohol/week: 0.0 oz    Comment: occ  Margarita 1-2 month  . Drug use: No  . Sexual activity: Yes    Birth control/protection: Surgical  Other Topics Concern  . Not on file  Social History Narrative   Lives at home with husband.  HS grad.  Works at [employer]  2 daughters.   Drinks 3-4 cups coffee or tea daily.      PHYSICAL EXAM  Vitals:   08/20/17 1313  BP: (!) 143/81  Pulse: 87   There is no height  or weight on file to calculate BMI.  Generalized: Well developed, in no acute distress   Neurological examination  Mentation: Alert oriented to time, place, history taking. Follows all commands speech and language fluent Cranial nerve II-XII: Pupils were equal round reactive to light. Extraocular movements were full, visual field were full on confrontational test. Facial sensation and strength were normal. Uvula tongue midline. Head turning and shoulder shrug  were normal and symmetric. Motor: The motor testing reveals 5 over 5 strength of all 4 extremities. Good  symmetric motor tone is noted throughout.  Sensory: Sensory testing is intact to soft touch on all 4 extremities. No evidence of extinction is noted.  Coordination: Cerebellar testing reveals good finger-nose-finger and heel-to-shin bilaterally.  Gait and station: Gait is normal.   Reflexes: Deep tendon reflexes are symmetric and normal bilaterally.   DIAGNOSTIC DATA (LABS, IMAGING, TESTING) - I reviewed patient records, labs, notes, testing and imaging myself where available.  Lab Results  Component Value Date   WBC 7.4 05/22/2014   HGB 14.7 05/22/2014   HCT 43.9 05/22/2014   MCV 89.2 05/22/2014   PLT 259 05/22/2014      Component Value Date/Time   NA 141 05/22/2014 1518   K 4.6 05/22/2014 1518   CL 102 05/22/2014 1518   CO2 29 05/22/2014 1518   GLUCOSE 89 05/22/2014 1518   BUN 14 05/22/2014 1518   CREATININE 0.64 05/22/2014 1518   CALCIUM 9.9 05/22/2014 1518   PROT 6.8 05/22/2014 1518   ALBUMIN 4.6 05/22/2014 1518   AST 18 05/22/2014 1518   ALT 21 05/22/2014 1518   ALKPHOS 60 05/22/2014 1518   BILITOT 0.5 05/22/2014 1518   Lab Results  Component Value Date   CHOL 262 (H) 05/22/2014   HDL 55 05/22/2014   LDLCALC 164 (H) 05/22/2014   TRIG 216 (H) 05/22/2014   CHOLHDL 4.8 05/22/2014   No results found for: HGBA1C No results found for: VITAMINB12 Lab Results  Component Value Date   TSH 1.430 05/22/2014       ASSESSMENT AND PLAN 56 y.o. year old female  has a past medical history of Anxiety, Arthritis, Depression, Fibromyalgia, GERD (gastroesophageal reflux disease), Insomnia, OSA (obstructive sleep apnea) (06/25/2016), and Vaginal dryness (05/22/2014). here with:  1.  Fibromyalgia  Overall the patient has remained stable.  She will continue on gralise.  Her primary care is providing her with hydrocodone with Nucynta and Cymbalta.  I recommended using a compounded cream on the joints.  This may offer her some relief for breakthrough pain.  She voiced understanding.  We will send a prescription over to [facility].  Patient is advised that if her symptoms worsen or she develops new symptoms she should let us know.  She will follow-up in 6 months or sooner if needed.  I spent 15 minutes with the patient. 50% of this time was spent discussing symptoms and treatment options.  Ward Givens, MSN, NP-C 08/20/2017, 1:18 PM [facility] [address], [place], [place] [address] [phone]

## 2017-08-20 NOTE — Progress Notes (Signed)
I have read the note, and I agree with the clinical assessment and plan.  Monica Davis,Monica Davis   

## 2017-08-20 NOTE — Patient Instructions (Addendum)
Your Plan:  Continue gralise Try compound cream  If your symptoms worsen or you develop new symptoms please let us know.   Thank you for coming to see [place] at [facility]. I hope we have been able to provide you high quality care today.  You may receive a patient satisfaction survey over the next few weeks. We would appreciate your feedback and comments so that we may continue to improve ourselves and the health of our patients.

## 2017-08-24 NOTE — Progress Notes (Signed)
New Rx successfully faxed to [facility].

## 2017-08-25 ENCOUNTER — Telehealth: Payer: Self-pay | Admitting: *Deleted

## 2017-08-25 NOTE — Telephone Encounter (Signed)
Received fax from [facility] of formula substitution per [facility] selection on Rx pad: meloxicam 0.5%, gabapentin 6%, orphenadrine 5%, lidocaine oint 5%

## 2017-10-01 ENCOUNTER — Other Ambulatory Visit: Payer: Self-pay | Admitting: Neurology

## 2017-10-01 NOTE — Telephone Encounter (Signed)
LVM for pt to call about rx refill request. Wanted to make sure she was still taking gralise and how she takes it.  Gave [facility] phone number for her to call back.

## 2017-10-01 NOTE — Telephone Encounter (Signed)
Pt called back. Verified she was still taking Gralise. She takes one 600mg  tablet at bedtime. She requested 90 day supply be sent to [facility]. Advised I will send prescription now for her. She verbalized understanding and appreciation.

## 2018-02-17 ENCOUNTER — Ambulatory Visit: Payer: BC Managed Care – PPO | Admitting: Adult Health

## 2018-04-07 ENCOUNTER — Other Ambulatory Visit: Payer: Self-pay | Admitting: Adult Health

## 2018-04-07 DIAGNOSIS — Z1231 Encounter for screening mammogram for malignant neoplasm of breast: Secondary | ICD-10-CM

## 2018-05-12 ENCOUNTER — Ambulatory Visit (HOSPITAL_COMMUNITY)
Admission: RE | Admit: 2018-05-12 | Discharge: 2018-05-12 | Disposition: A | Discharge status: Home / Self Care | Payer: BC Managed Care – PPO | Source: Ambulatory Visit | Attending: Adult Health | Admitting: Adult Health

## 2018-05-12 ENCOUNTER — Ambulatory Visit (HOSPITAL_COMMUNITY): Payer: Self-pay

## 2018-05-12 DIAGNOSIS — Z1231 Encounter for screening mammogram for malignant neoplasm of breast: Secondary | ICD-10-CM | POA: Insufficient documentation

## 2018-05-24 ENCOUNTER — Telehealth: Payer: Self-pay | Admitting: *Deleted

## 2018-05-24 NOTE — Telephone Encounter (Signed)
Feels dry, try luvena and use astroglide with sex, if not better, make appt.

## 2018-06-10 ENCOUNTER — Ambulatory Visit: Payer: BC Managed Care – PPO | Admitting: Adult Health

## 2018-06-10 ENCOUNTER — Encounter: Payer: Self-pay | Admitting: Adult Health

## 2018-06-10 ENCOUNTER — Other Ambulatory Visit: Payer: Self-pay

## 2018-06-10 VITALS — BP 116/65 | HR 76 | Ht 65.0 in | Wt 213.0 lb

## 2018-06-10 DIAGNOSIS — F32A Depression, unspecified: Secondary | ICD-10-CM | POA: Insufficient documentation

## 2018-06-10 DIAGNOSIS — R195 Other fecal abnormalities: Secondary | ICD-10-CM | POA: Insufficient documentation

## 2018-06-10 DIAGNOSIS — Z1211 Encounter for screening for malignant neoplasm of colon: Secondary | ICD-10-CM | POA: Diagnosis not present

## 2018-06-10 DIAGNOSIS — Z01419 Encounter for gynecological examination (general) (routine) without abnormal findings: Secondary | ICD-10-CM | POA: Insufficient documentation

## 2018-06-10 DIAGNOSIS — F329 Major depressive disorder, single episode, unspecified: Secondary | ICD-10-CM | POA: Insufficient documentation

## 2018-06-10 DIAGNOSIS — N898 Other specified noninflammatory disorders of vagina: Secondary | ICD-10-CM

## 2018-06-10 DIAGNOSIS — Z1212 Encounter for screening for malignant neoplasm of rectum: Secondary | ICD-10-CM | POA: Diagnosis not present

## 2018-06-10 LAB — HEMOCCULT GUIAC POC 1CARD (OFFICE): Fecal Occult Blood, POC: POSITIVE — AB

## 2018-06-10 MED ORDER — ESTROGENS, CONJUGATED 0.625 MG/GM VA CREA: TOPICAL_CREAM | VAGINAL | 0 refills | Status: DC

## 2018-06-10 NOTE — Progress Notes (Signed)
Patient ID: Monica Davis, female   DOB: May 19, 1961, 57 y.o.   MRN: [id number] History of Present Illness: Monica Davis is a 57 year old white female, married, retired,sp hysterectomy in for a well woman gyn exam and complains of vaginal dryness and pain with sex.The last 3 years have been hard on her.Her only brother died about 3 years ago, and her mom died this year, and her dad who is 37 moved in with her(and has ?bladder cancer), and her mother in law has pancreatic cancer. PCP is Dr Nevada Crane.   Current Medications, Allergies, Past Medical History, Past Surgical History, Family History and Social History were reviewed in [employer] record.     Review of Systems: Patient denies any headaches, hearing loss, fatigue, blurred vision, shortness of breath, chest pain, abdominal pain, problems with bowel movements, urination(feels like not emptying at times), or intercourse(pain with sex, feels like skin tears). No joint pain or mood swings. +vaginal dryness, tired luvena but it hurts when inserted    Physical Exam:BP 116/65 (BP Location: Left Arm, Patient Position: Sitting, Cuff Size: Normal)   Pulse 76   Ht 5\' 5"  (1.651 m)   Wt 213 lb (96.6 kg)   BMI 35.45 kg/m  General:  Well developed, well nourished, no acute distress Skin:  Warm and dry Neck:  Midline trachea, normal thyroid, good ROM, no lymphadenopathy Lungs; Clear to auscultation bilaterally Breast:  No dominant palpable mass, retraction, or nipple discharge Cardiovascular: Regular rate and rhythm Abdomen:  Soft, non tender, no hepatosplenomegaly Pelvic:  External genitalia is normal in appearance, no lesions.  The vagina is pale with loss of moisture and rugae. Urethra has no lesions or masses. The cervix and uterus are absent. No adnexal masses or tenderness noted.Bladder is non tender, no masses felt. Rectal: Good sphincter tone, no polyps, or hemorrhoids felt.  Hemoccult positive.. Extremities/musculoskeletal:   No swelling or varicosities noted, no clubbing or cyanosis Psych:  No mood changes, alert and cooperative,seems happy PHQ 9 score 12, is on meds, and sees psychiatrist and therapist, denies being suicidal. Examination chaperoned by Estill Bamberg Rash LPN. Discussed try Premarin vaginal cream and she agrees.  Impression:   ICD-10-CM   1. Encounter for well woman exam with routine gynecological exam Z01.419   2. Screening for colorectal cancer Z12.11 POCT occult blood stool   Z12.12   3. Vaginal dryness N89.8   4. Depression, unspecified depression type F32.9   5. Fecal occult blood test positive R19.5       Plan: 3 hemoccult cards sent home with her to do Meds ordered this encounter  Medications  . conjugated estrogens (PREMARIN) vaginal cream    Sig: Put 0.5 gm in vagina daily at bedtime for 2 weeks then 2 x weekly    Dispense:  20 g    Refill:  0    Order Specific Question:   Supervising Provider    Answer:   Tania Ade H [[id number]]  Use good lubricate Physical in 1 year Labs with PCP Colonoscopy per GI, Dr Laural Golden

## 2018-06-16 ENCOUNTER — Other Ambulatory Visit (INDEPENDENT_AMBULATORY_CARE_PROVIDER_SITE_OTHER): Payer: BC Managed Care – PPO

## 2018-06-16 DIAGNOSIS — Z1211 Encounter for screening for malignant neoplasm of colon: Secondary | ICD-10-CM

## 2018-06-16 DIAGNOSIS — Z1212 Encounter for screening for malignant neoplasm of rectum: Secondary | ICD-10-CM

## 2018-06-16 LAB — HEMOCCULT GUIAC POC 1CARD (OFFICE)
Card #2 Fecal Occult Blod, POC: NEGATIVE
Card #3 Fecal Occult Blood, POC: NEGATIVE
Fecal Occult Blood, POC: NEGATIVE

## 2018-10-05 ENCOUNTER — Other Ambulatory Visit: Payer: Self-pay | Admitting: Adult Health

## 2019-03-08 DIAGNOSIS — B029 Zoster without complications: Secondary | ICD-10-CM | POA: Diagnosis not present

## 2019-04-04 ENCOUNTER — Other Ambulatory Visit (HOSPITAL_COMMUNITY): Payer: Self-pay | Admitting: Adult Health

## 2019-04-04 DIAGNOSIS — Z1231 Encounter for screening mammogram for malignant neoplasm of breast: Secondary | ICD-10-CM

## 2019-05-02 DIAGNOSIS — F331 Major depressive disorder, recurrent, moderate: Secondary | ICD-10-CM | POA: Diagnosis not present

## 2019-05-02 DIAGNOSIS — F4310 Post-traumatic stress disorder, unspecified: Secondary | ICD-10-CM | POA: Diagnosis not present

## 2019-05-02 DIAGNOSIS — F411 Generalized anxiety disorder: Secondary | ICD-10-CM | POA: Diagnosis not present

## 2019-05-03 DIAGNOSIS — R7303 Prediabetes: Secondary | ICD-10-CM | POA: Diagnosis not present

## 2019-05-03 DIAGNOSIS — E782 Mixed hyperlipidemia: Secondary | ICD-10-CM | POA: Diagnosis not present

## 2019-05-03 DIAGNOSIS — E119 Type 2 diabetes mellitus without complications: Secondary | ICD-10-CM | POA: Diagnosis not present

## 2019-05-03 DIAGNOSIS — E1169 Type 2 diabetes mellitus with other specified complication: Secondary | ICD-10-CM | POA: Diagnosis not present

## 2019-05-03 DIAGNOSIS — E785 Hyperlipidemia, unspecified: Secondary | ICD-10-CM | POA: Diagnosis not present

## 2019-05-06 DIAGNOSIS — G4700 Insomnia, unspecified: Secondary | ICD-10-CM | POA: Diagnosis not present

## 2019-05-06 DIAGNOSIS — E782 Mixed hyperlipidemia: Secondary | ICD-10-CM | POA: Diagnosis not present

## 2019-05-06 DIAGNOSIS — M797 Fibromyalgia: Secondary | ICD-10-CM | POA: Diagnosis not present

## 2019-05-06 DIAGNOSIS — Z6833 Body mass index (BMI) 33.0-33.9, adult: Secondary | ICD-10-CM | POA: Diagnosis not present

## 2019-05-06 DIAGNOSIS — E875 Hyperkalemia: Secondary | ICD-10-CM | POA: Diagnosis not present

## 2019-05-06 DIAGNOSIS — F331 Major depressive disorder, recurrent, moderate: Secondary | ICD-10-CM | POA: Diagnosis not present

## 2019-05-06 DIAGNOSIS — E1169 Type 2 diabetes mellitus with other specified complication: Secondary | ICD-10-CM | POA: Diagnosis not present

## 2019-05-06 DIAGNOSIS — G8929 Other chronic pain: Secondary | ICD-10-CM | POA: Diagnosis not present

## 2019-05-16 ENCOUNTER — Ambulatory Visit (HOSPITAL_COMMUNITY): Payer: BC Managed Care – PPO

## 2019-06-23 ENCOUNTER — Ambulatory Visit (HOSPITAL_COMMUNITY)
Admission: RE | Admit: 2019-06-23 | Discharge: 2019-06-23 | Disposition: A | Discharge status: Home / Self Care | Payer: Medicare Other | Source: Ambulatory Visit | Attending: Adult Health | Admitting: Adult Health

## 2019-06-23 ENCOUNTER — Other Ambulatory Visit: Payer: Self-pay

## 2019-06-23 DIAGNOSIS — Z1231 Encounter for screening mammogram for malignant neoplasm of breast: Secondary | ICD-10-CM | POA: Insufficient documentation

## 2019-07-22 DIAGNOSIS — Z23 Encounter for immunization: Secondary | ICD-10-CM | POA: Diagnosis not present

## 2019-08-03 DIAGNOSIS — R197 Diarrhea, unspecified: Secondary | ICD-10-CM | POA: Diagnosis not present

## 2020-01-20 DIAGNOSIS — B379 Candidiasis, unspecified: Secondary | ICD-10-CM | POA: Diagnosis not present

## 2020-01-30 DIAGNOSIS — L2489 Irritant contact dermatitis due to other agents: Secondary | ICD-10-CM | POA: Diagnosis not present

## 2020-04-05 ENCOUNTER — Telehealth: Payer: Self-pay | Admitting: Adult Health

## 2020-04-05 NOTE — Telephone Encounter (Signed)
Pt states that her daughter was here and had genetic labs drawn. She wanted to see if Monica Davis would order them on her too or if she needed her pap/physical first.

## 2020-04-05 NOTE — Telephone Encounter (Signed)
Pts daughter had positive test results on [facility]. Patient coming Monday at 10 am to see Tish for [facility] test.

## 2020-04-05 NOTE — Telephone Encounter (Signed)
Patient wants to do genetic testing bloodwork. And would like it to be scheduled for Monday 04/09/20

## 2020-04-06 ENCOUNTER — Telehealth: Payer: Self-pay | Admitting: *Deleted

## 2020-04-06 NOTE — Telephone Encounter (Signed)
Called patient regarding appointment and the following message was left:   Updated visitor policy: We are now allowing one support person with you during your upcoming visit.  However, we do ask that they wear a mask and will also be screened at check-in.   We ask if you are sick, have any symptoms of COVID, have had any exposure to anyone suspected or confirmed of having COVID-19, or are awaiting test results for COVID-19, to call our office as we may need to reschedule you for a virtual visit or schedule your appointment for a later date.    Please know we will ask you these questions or similar questions when you arrive for your appointment and understand this is how we are keeping everyone safe.    Also,to keep you safe, please use the provided hand sanitizer when you enter the office. We are asking everyone in the office to wear a mask to help prevent the spread of germs. If you have a mask of your own, please wear it to your appointment, if not, we are happy to provide one for you.  Thank you for understanding and your cooperation.    [facility] Staff

## 2020-04-09 ENCOUNTER — Encounter: Payer: Self-pay | Admitting: Adult Health

## 2020-04-09 ENCOUNTER — Other Ambulatory Visit: Payer: Self-pay

## 2020-04-09 ENCOUNTER — Other Ambulatory Visit (INDEPENDENT_AMBULATORY_CARE_PROVIDER_SITE_OTHER): Payer: Medicare PPO

## 2020-04-09 DIAGNOSIS — Z8489 Family history of other specified conditions: Secondary | ICD-10-CM

## 2020-04-09 DIAGNOSIS — Z1371 Encounter for nonprocreative screening for genetic disease carrier status: Secondary | ICD-10-CM

## 2020-04-09 NOTE — Progress Notes (Signed)
Patient her for [facility] test draw.  Daughter screened positive. Labs drawn with no complications.

## 2020-04-20 ENCOUNTER — Telehealth: Payer: Self-pay | Admitting: Adult Health

## 2020-04-20 NOTE — Telephone Encounter (Signed)
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 Will get her number to Maree Krabbe [phone] call to discuss further, she is aware asa daughter had same gene variant.

## 2020-04-25 ENCOUNTER — Telehealth: Payer: Self-pay | Admitting: Adult Health

## 2020-04-25 ENCOUNTER — Encounter: Payer: Self-pay | Admitting: Adult Health

## 2020-04-25 DIAGNOSIS — Z1589 Genetic susceptibility to other disease: Secondary | ICD-10-CM

## 2020-04-25 DIAGNOSIS — D126 Benign neoplasm of colon, unspecified: Secondary | ICD-10-CM

## 2020-04-25 HISTORY — DX: Genetic susceptibility to other disease: Z15.89

## 2020-04-25 HISTORY — DX: Benign neoplasm of colon, unspecified: D12.6

## 2020-04-25 NOTE — Telephone Encounter (Signed)
Pt aware of +gene varian MUTYH and is going to talk with genetic counselor

## 2020-04-30 ENCOUNTER — Telehealth: Payer: Self-pay | Admitting: Physician Assistant

## 2020-04-30 NOTE — Telephone Encounter (Signed)
Phone call to patient to inform her that we would have to see her before we could give her any medical advice.  Patient aware, patient states that she's contacted her GYN to see if they can get her in sooner.

## 2020-04-30 NOTE — Telephone Encounter (Signed)
Wanted to talk to [facility]. Since April has possible yeast infection under breast and it doesn't go away. Wants advice, doesn't want to wait for her September appt. Her PCP Rx'd nystatin + steroid + 3 days of fluconazole. (Told her since hasn't been seen here for it, no advice likely before appt.)

## 2020-05-01 ENCOUNTER — Ambulatory Visit: Payer: Medicare PPO | Admitting: Adult Health

## 2020-05-01 ENCOUNTER — Encounter: Payer: Medicare PPO | Admitting: Adult Health

## 2020-05-01 ENCOUNTER — Encounter: Payer: Self-pay | Admitting: Adult Health

## 2020-05-01 VITALS — BP 123/81 | HR 91 | Ht 65.0 in | Wt 216.0 lb

## 2020-05-01 DIAGNOSIS — B369 Superficial mycosis, unspecified: Secondary | ICD-10-CM | POA: Diagnosis not present

## 2020-05-01 MED ORDER — FLUCONAZOLE 100 MG PO TABS: ORAL_TABLET | ORAL | 0 refills | Status: DC

## 2020-05-01 NOTE — Progress Notes (Addendum)
  Subjective:     Patient ID: Monica Davis, female   DOB: 02-Feb-1961, 59 y.o.   MRN: [id number]  HPI Monica Davis is a 60 year old white female, married,sp hysterectomy in complaining of rash and burning and itching under both breast since April, has tried nystatin cream,temovate and finally triple paste cream,and the triple paste helped. PCP is Dr Nevada Crane.  Review of Systems Has rash with itching and burning under both breasts since April Reviewed past medical,surgical, social and family history. Reviewed medications and allergies.     Objective:   Physical Exam BP 123/81 (BP Location: Left Arm, Patient Position: Sitting, Cuff Size: Normal)   Pulse 91   Ht 5\' 5"  (1.651 m)   Wt 216 lb (98 kg)   BMI 35.94 kg/m   Skin warm and dry,  Breasts:no dominate palpable mass, retraction or nipple discharge Has redness and peeling under both breasts but she says much better after triple paste cream. AA is 1 Fall risk is low PHQ 9 score is 6, no SI.     Assessment:     1. Superficial fungus infection of skin Will rx diflucan, and try Zeasorb to keep dry Can use cotton handkerchief in bra and replace often, to keep dry  Meds ordered this encounter  Medications  . fluconazole (DIFLUCAN) 100 MG tablet    Sig: Take 1 daily for 10 days    Dispense:  10 tablet    Refill:  0    Order Specific Question:   Supervising Provider    Answer:   Florian Buff [[id number]]      Plan:    Follow up prn

## 2020-05-02 NOTE — Progress Notes (Signed)
This encounter was created in error - please disregard.

## 2020-05-03 ENCOUNTER — Encounter: Payer: Medicare PPO | Admitting: Adult Health

## 2020-06-08 ENCOUNTER — Other Ambulatory Visit (HOSPITAL_COMMUNITY): Payer: Self-pay | Admitting: Adult Health

## 2020-06-08 DIAGNOSIS — Z1231 Encounter for screening mammogram for malignant neoplasm of breast: Secondary | ICD-10-CM

## 2020-06-11 DIAGNOSIS — R10816 Epigastric abdominal tenderness: Secondary | ICD-10-CM | POA: Diagnosis not present

## 2020-06-11 DIAGNOSIS — R760 Raised antibody titer: Secondary | ICD-10-CM | POA: Diagnosis not present

## 2020-06-11 DIAGNOSIS — E1169 Type 2 diabetes mellitus with other specified complication: Secondary | ICD-10-CM | POA: Diagnosis not present

## 2020-06-11 DIAGNOSIS — G4700 Insomnia, unspecified: Secondary | ICD-10-CM | POA: Diagnosis not present

## 2020-06-11 DIAGNOSIS — Z6833 Body mass index (BMI) 33.0-33.9, adult: Secondary | ICD-10-CM | POA: Diagnosis not present

## 2020-06-11 DIAGNOSIS — B029 Zoster without complications: Secondary | ICD-10-CM | POA: Diagnosis not present

## 2020-06-11 DIAGNOSIS — R110 Nausea: Secondary | ICD-10-CM | POA: Diagnosis not present

## 2020-06-11 DIAGNOSIS — G8929 Other chronic pain: Secondary | ICD-10-CM | POA: Diagnosis not present

## 2020-06-11 DIAGNOSIS — R700 Elevated erythrocyte sedimentation rate: Secondary | ICD-10-CM | POA: Diagnosis not present

## 2020-06-11 DIAGNOSIS — Z0000 Encounter for general adult medical examination without abnormal findings: Secondary | ICD-10-CM | POA: Diagnosis not present

## 2020-06-19 DIAGNOSIS — G8929 Other chronic pain: Secondary | ICD-10-CM | POA: Diagnosis not present

## 2020-06-19 DIAGNOSIS — E782 Mixed hyperlipidemia: Secondary | ICD-10-CM | POA: Diagnosis not present

## 2020-06-19 DIAGNOSIS — G4700 Insomnia, unspecified: Secondary | ICD-10-CM | POA: Diagnosis not present

## 2020-06-19 DIAGNOSIS — E1169 Type 2 diabetes mellitus with other specified complication: Secondary | ICD-10-CM | POA: Diagnosis not present

## 2020-06-19 DIAGNOSIS — E875 Hyperkalemia: Secondary | ICD-10-CM | POA: Diagnosis not present

## 2020-06-19 DIAGNOSIS — M797 Fibromyalgia: Secondary | ICD-10-CM | POA: Diagnosis not present

## 2020-06-19 DIAGNOSIS — F331 Major depressive disorder, recurrent, moderate: Secondary | ICD-10-CM | POA: Diagnosis not present

## 2020-06-19 DIAGNOSIS — M25551 Pain in right hip: Secondary | ICD-10-CM | POA: Diagnosis not present

## 2020-06-25 ENCOUNTER — Other Ambulatory Visit: Payer: Self-pay

## 2020-06-25 ENCOUNTER — Ambulatory Visit (HOSPITAL_COMMUNITY)
Admission: RE | Admit: 2020-06-25 | Discharge: 2020-06-25 | Disposition: A | Discharge status: Home / Self Care | Payer: Medicare PPO | Source: Ambulatory Visit | Attending: Adult Health | Admitting: Adult Health

## 2020-06-25 DIAGNOSIS — Z1231 Encounter for screening mammogram for malignant neoplasm of breast: Secondary | ICD-10-CM | POA: Insufficient documentation

## 2020-07-10 ENCOUNTER — Ambulatory Visit: Payer: Medicare PPO | Admitting: Physician Assistant

## 2020-07-13 DIAGNOSIS — Z23 Encounter for immunization: Secondary | ICD-10-CM | POA: Diagnosis not present

## 2020-07-31 DIAGNOSIS — M25551 Pain in right hip: Secondary | ICD-10-CM | POA: Diagnosis not present

## 2020-07-31 DIAGNOSIS — M7061 Trochanteric bursitis, right hip: Secondary | ICD-10-CM | POA: Diagnosis not present

## 2020-07-31 DIAGNOSIS — M545 Low back pain, unspecified: Secondary | ICD-10-CM | POA: Diagnosis not present

## 2020-10-15 DIAGNOSIS — R1030 Lower abdominal pain, unspecified: Secondary | ICD-10-CM | POA: Diagnosis not present

## 2020-10-15 DIAGNOSIS — R110 Nausea: Secondary | ICD-10-CM | POA: Diagnosis not present

## 2020-10-15 DIAGNOSIS — R197 Diarrhea, unspecified: Secondary | ICD-10-CM | POA: Diagnosis not present

## 2020-10-16 DIAGNOSIS — R443 Hallucinations, unspecified: Secondary | ICD-10-CM | POA: Diagnosis not present

## 2020-10-16 DIAGNOSIS — R1030 Lower abdominal pain, unspecified: Secondary | ICD-10-CM | POA: Diagnosis not present

## 2020-10-30 ENCOUNTER — Ambulatory Visit (INDEPENDENT_AMBULATORY_CARE_PROVIDER_SITE_OTHER): Payer: Medicare PPO | Admitting: Internal Medicine

## 2020-11-05 ENCOUNTER — Other Ambulatory Visit: Payer: Self-pay

## 2020-11-05 ENCOUNTER — Encounter (INDEPENDENT_AMBULATORY_CARE_PROVIDER_SITE_OTHER): Payer: Self-pay | Admitting: Internal Medicine

## 2020-11-05 ENCOUNTER — Ambulatory Visit (INDEPENDENT_AMBULATORY_CARE_PROVIDER_SITE_OTHER): Payer: Medicare PPO | Admitting: Internal Medicine

## 2020-11-05 DIAGNOSIS — Z1589 Genetic susceptibility to other disease: Secondary | ICD-10-CM | POA: Diagnosis not present

## 2020-11-05 DIAGNOSIS — K589 Irritable bowel syndrome without diarrhea: Secondary | ICD-10-CM

## 2020-11-05 NOTE — Progress Notes (Signed)
Reason for consultation  Abdominal pain and diarrhea. Patient discovered to have MUTYH gene.  History of present illness  Patient is 60 year old Caucasian female who is referred through courtesy of Dr. Delphina Cahill for GI evaluation.  Patient states she was formally diagnosed with IBS by doctors alcohol although Dr. Luan Pulling at also felt that she had irritable bowel syndrome.  She gives history of intermittent lower abdominal pain as well as diarrhea with urgency but occasionally she may be constipated. Patient states she had bad spell with diarrhea and lower abdominal pain in last week of December 2021.  She felt that she had an infection.  She did not seek help in emergency room.  She went on a limited diet and slowly improved. She states diarrhea occurs in spells with loss felt was the worst.  On most days she has 1-5 stools.  Occasionally she may skip a day.  She also has noted urgency with accident on some occasions.  She is trying to eat more apples and fruits.  She has not experienced nocturnal diarrhea or bowel movement.  She also denies melena or rectal bleeding.  She has nausea when she has spells of diarrhea but no vomiting.  She says her her appetite is fair.  She has some days when her appetite is good and on other days not so good.  She has lost 40 pounds in the last 2-1/2 years.  She decided to lose weight because of elevated blood glucose levels.  She was on Metformin short-term.  As she lost weight her glucose levels returned to normal and this therapy was discontinued. She was begun on dicyclomine and ondansetron early this month that she has been using on as-needed basis. She had genetic testing by Ms. Derrek Monaco, NP and was found to have MUTYH gene associated with increased risk of colon cancer.  Her last colonoscopy was in in July 2016 revealing scattered diverticula throughout colon external hemorrhoids and 2 small anal papillae.  Current Medications: Outpatient Encounter  Medications as of 11/05/2020  Medication Sig  . ALPRAZolam (XANAX) 0.5 MG tablet Take 0.5 mg by mouth at bedtime as needed for anxiety.  . dicyclomine (BENTYL) 10 MG capsule Take 10 mg by mouth as needed for spasms.  Marland Kitchen docusate sodium (COLACE) 100 MG capsule Take 100 mg by mouth 2 (two) times daily.  . DULoxetine (CYMBALTA) 60 MG capsule Take 120 mg by mouth daily.  Marland Kitchen gabapentin (NEURONTIN) 600 MG tablet Take 600 mg by mouth at bedtime.  . GRALISE 600 MG TABS TAKE ONE TABLET BY MOUTH AT BEDTIME  . NUCYNTA 50 MG tablet Take 50 mg by mouth daily. for pain  . OVER THE COUNTER MEDICATION Immune 24 one po Q Day  . Probiotic Product (DIGESTIVE ADV+BOWEL SUPPORT) CAPS Take 1 capsule by mouth daily.  Marland Kitchen tiZANidine (ZANAFLEX) 4 MG tablet 4 mg at bedtime. And as needed per patient.  Marland Kitchen atorvastatin (LIPITOR) 20 MG tablet  (Patient not taking: Reported on 11/05/2020)  . [DISCONTINUED] DULoxetine (CYMBALTA) 30 MG capsule   . [DISCONTINUED] fluconazole (DIFLUCAN) 100 MG tablet Take 1 daily for 10 days  . [DISCONTINUED] naproxen sodium (ALEVE) 220 MG tablet Take 220 mg by mouth 2 (two) times daily with a meal. (Patient not taking: Reported on 05/01/2020)  . [DISCONTINUED] NONFORMULARY OR COMPOUNDED ITEM  (Patient not taking: Reported on 05/01/2020)   No facility-administered encounter medications on file as of 11/05/2020.   Past medical history  Irritable bowel syndrome. Obstructive sleep apnea Depression  and anxiety. GERD. Fibromyalgia. Patient carries MUTYH gene(reported on 04/18/2020).  Bilateral salpingo-oophorectomy with hysterectomy for uterine fibroids and fibrocystic ovaries. Cholecystectomy. Hemorrhoidectomy. Screening colonoscopy in July 2016 revealing pancolonic diverticulosis and external hemorrhoids.   Allergies  No Known Allergies  Family history  Father is 20 years old.  He had right kidney removed in 2019 and now undergoing therapy for bladder cancer and doing well. Mother had  multiple health problems and died at age 95. She had 1 brother with history of valvular heart disease pancreatitis and diabetes mellitus and he committed suicide at age 17.  Social history  Patient is married.  She has 2 daughters ages 11 and 28 and they also [place] gene.  She drinks alcohol no more than 1 or 2 drinks in a month and she has never smoked cigarettes.  She is retired.  Physical examination  Blood pressure 110/67, pulse (!) 103, temperature 99.4 F (37.4 C), temperature source Oral, height 5\' 5"  (1.651 m), weight 203 lb (92.1 kg). Patient is alert and in no acute distress. She is wearing a mask. Conjunctiva is pink. Sclera is nonicteric Oropharyngeal mucosa is normal. No neck masses or thyromegaly noted. Cardiac exam with regular rhythm normal S1 and S2. No murmur or gallop noted. Lungs are clear to auscultation. Abdomen is full.  Bowel sounds are normal.  On palpation abdomen is soft.  She has mild tenderness at LLQ on deep palpation.  No organomegaly or masses. No LE edema or clubbing noted.  Labs/studies Results: No recent lab data on file.  Assessment:  #1.  Irritable bowel syndrome.  Patient appears to have diarrhea predominant IBS.  Recent bout of diarrhea and lower abdominal pain may be due to viral infection or food poisoning.  Diverticulitis would be less likely as diarrhea was the predominant symptom.  She will undergo stool studies if if stool is still loose.  She also needs to be ruled out for celiac disease.    #2.  Patient carries MUTYH gene.  She had screening colonoscopy back in July 2016 and no polyps were found.  Based on these findings she is deemed to be high risk for colon carcinoma and therefore screening schedule needs to be changed to every 5 years.   Recommendations  GI pathogen panel. Celiac antibody panel. Patient advised to take dicyclomine 10 mg daily before breakfast and use second dose on as-needed basis. I would also recommend using  stool softener on schedule rather than as needed and she can try taking 100 mg Monday Wednesday Friday. High risk screening colonoscopy to be scheduled in the next few weeks. Office visit in 3 months.

## 2020-11-05 NOTE — Patient Instructions (Signed)
Take dicyclomine 10 mg daily before breakfast and subsequent doses on as-needed basis Take Colace/stool softener 3 times a week. Physician will call with results of stool test and blood work. Colonoscopy to be scheduled after above completed.

## 2020-11-09 DIAGNOSIS — K589 Irritable bowel syndrome without diarrhea: Secondary | ICD-10-CM | POA: Diagnosis not present

## 2020-11-12 DIAGNOSIS — R197 Diarrhea, unspecified: Secondary | ICD-10-CM | POA: Diagnosis not present

## 2020-11-12 DIAGNOSIS — K589 Irritable bowel syndrome without diarrhea: Secondary | ICD-10-CM | POA: Diagnosis not present

## 2020-11-12 LAB — CELIAC DISEASE PANEL
(tTG) Ab, IgA: <1 U/mL
(tTG) Ab, IgG: <1 U/mL
Gliadin IgA: <1 U/mL
Gliadin IgG: <1 U/mL
Immunoglobulin A: 76 mg/dL (ref 47–310)

## 2020-11-15 LAB — GASTROINTESTINAL PATHOGEN PANEL PCR

## 2020-11-19 LAB — GASTROINTESTINAL PATHOGEN PANEL PCR
C. difficile Tox A/B, PCR: NOT DETECTED
Campylobacter, PCR: NOT DETECTED
Cryptosporidium, PCR: NOT DETECTED
E coli (ETEC) LT/ST PCR: NOT DETECTED
E coli (STEC) stx1/stx2, PCR: NOT DETECTED
E coli 0157, PCR: NOT DETECTED
Giardia lamblia, PCR: NOT DETECTED
Norovirus, PCR: NOT DETECTED
Rotavirus A, PCR: NOT DETECTED
Salmonella, PCR: NOT DETECTED
Shigella, PCR: NOT DETECTED

## 2020-11-27 DIAGNOSIS — G4700 Insomnia, unspecified: Secondary | ICD-10-CM | POA: Diagnosis not present

## 2020-11-27 DIAGNOSIS — F331 Major depressive disorder, recurrent, moderate: Secondary | ICD-10-CM | POA: Diagnosis not present

## 2020-11-27 DIAGNOSIS — M797 Fibromyalgia: Secondary | ICD-10-CM | POA: Diagnosis not present

## 2020-11-27 DIAGNOSIS — K582 Mixed irritable bowel syndrome: Secondary | ICD-10-CM | POA: Diagnosis not present

## 2020-11-27 DIAGNOSIS — F411 Generalized anxiety disorder: Secondary | ICD-10-CM | POA: Diagnosis not present

## 2020-11-30 ENCOUNTER — Telehealth (INDEPENDENT_AMBULATORY_CARE_PROVIDER_SITE_OTHER): Payer: Self-pay

## 2020-11-30 ENCOUNTER — Other Ambulatory Visit (INDEPENDENT_AMBULATORY_CARE_PROVIDER_SITE_OTHER): Payer: Self-pay

## 2020-11-30 DIAGNOSIS — K589 Irritable bowel syndrome without diarrhea: Secondary | ICD-10-CM

## 2020-11-30 DIAGNOSIS — Z1211 Encounter for screening for malignant neoplasm of colon: Secondary | ICD-10-CM

## 2020-11-30 DIAGNOSIS — Z1589 Genetic susceptibility to other disease: Secondary | ICD-10-CM

## 2020-11-30 MED ORDER — SUPREP BOWEL PREP KIT 17.5-3.13-1.6 GM/177ML PO SOLN: 1.0000 | ORAL | 0 refills | Status: AC

## 2020-11-30 NOTE — Telephone Encounter (Signed)
LeighAnn Zachari Alberta, CMA  

## 2020-12-03 ENCOUNTER — Encounter (INDEPENDENT_AMBULATORY_CARE_PROVIDER_SITE_OTHER): Payer: Self-pay

## 2020-12-14 NOTE — Patient Instructions (Signed)
Monica Davis  12/14/2020     @PREFPERIOPPHARMACY @   Your procedure is scheduled on  12/19/2020.   Report to [facility] at  0730  A.M.   Call this number if you have problems the morning of surgery:  [phone]   Remember:  Follow the diet and prep instructions given to you by the office.                    Take these medicines the morning of surgery with A SIP OF WATER  Cymbalta, gabapentin, hydrocodone(if needed), zanaflex.    Please brush your teeth.  Do not wear jewelry, make-up or nail polish.  Do not wear lotions, powders, or perfumes, or deodorant.  Do not shave 48 hours prior to surgery.  Men may shave face and neck.  Do not bring valuables to the hospital.  [facility] is not responsible for any belongings or valuables.  Contacts, dentures or bridgework may not be worn into surgery.  Leave your suitcase in the car.  After surgery it may be brought to your room.  For patients admitted to the hospital, discharge time will be determined by your treatment team.  Patients discharged the day of surgery will not be allowed to drive home and must have someone with them for 24 hours.    Special instructions:  DO NOT smoke tobacco or vapr the morning of your procedure.    Please read over the following fact sheets that you were given. Anesthesia Post-op Instructions and Care and Recovery After Surgery       Colonoscopy, Adult, Care After This sheet gives you information about how to care for yourself after your procedure. Your health care provider may also give you more specific instructions. If you have problems or questions, contact your health care provider. What can I expect after the procedure? After the procedure, it is common to have:  A small amount of blood in your stool for 24 hours after the procedure.  Some gas.  Mild cramping or bloating of your abdomen. Follow these instructions at home: Eating and drinking  Drink enough fluid to  keep your urine pale yellow.  Follow instructions from your health care provider about eating or drinking restrictions.  Resume your normal diet as instructed by your health care provider. Avoid heavy or fried foods that are hard to digest.   Activity  Rest as told by your health care provider.  Avoid sitting for a long time without moving. Get up to take short walks every 1-2 hours. This is important to improve blood flow and breathing. Ask for help if you feel weak or unsteady.  Return to your normal activities as told by your health care provider. Ask your health care provider what activities are safe for you. Managing cramping and bloating  Try walking around when you have cramps or feel bloated.  Apply heat to your abdomen as told by your health care provider. Use the heat source that your health care provider recommends, such as a moist heat pack or a heating pad. ? Place a towel between your skin and the heat source. ? Leave the heat on for 20-30 minutes. ? Remove the heat if your skin turns bright red. This is especially important if you are unable to feel pain, heat, or cold. You may have a greater risk of getting burned.   General instructions  If you were given a sedative during the procedure, it  can affect you for several hours. Do not drive or operate machinery until your health care provider says that it is safe.  For the first 24 hours after the procedure: ? Do not sign important documents. ? Do not drink alcohol. ? Do your regular daily activities at a slower pace than normal. ? Eat soft foods that are easy to digest.  Take over-the-counter and prescription medicines only as told by your health care provider.  Keep all follow-up visits as told by your health care provider. This is important. Contact a health care provider if:  You have blood in your stool 2-3 days after the procedure. Get help right away if you have:  More than a small spotting of blood in your  stool.  Large blood clots in your stool.  Swelling of your abdomen.  Nausea or vomiting.  A fever.  Increasing pain in your abdomen that is not relieved with medicine. Summary  After the procedure, it is common to have a small amount of blood in your stool. You may also have mild cramping and bloating of your abdomen.  If you were given a sedative during the procedure, it can affect you for several hours. Do not drive or operate machinery until your health care provider says that it is safe.  Get help right away if you have a lot of blood in your stool, nausea or vomiting, a fever, or increased pain in your abdomen. This information is not intended to replace advice given to you by your health care provider. Make sure you discuss any questions you have with your health care provider. Document Revised: 09/23/2019 Document Reviewed: 04/25/2019 Elsevier Patient Education  2021 [place] After This sheet gives you information about how to care for yourself after your procedure. Your health care provider may also give you more specific instructions. If you have problems or questions, contact your health care provider. What can I expect after the procedure? After the procedure, it is common to have:  Tiredness.  Forgetfulness about what happened after the procedure.  Impaired judgment for important decisions.  Nausea or vomiting.  Some difficulty with balance. Follow these instructions at home: For the time period you were told by your health care provider:  Rest as needed.  Do not participate in activities where you could fall or become injured.  Do not drive or use machinery.  Do not drink alcohol.  Do not take sleeping pills or medicines that cause drowsiness.  Do not make important decisions or sign legal documents.  Do not take care of children on your own.      Eating and drinking  Follow the diet that is recommended by your  health care provider.  Drink enough fluid to keep your urine pale yellow.  If you vomit: ? Drink water, juice, or soup when you can drink without vomiting. ? Make sure you have little or no nausea before eating solid foods. General instructions  Have a responsible adult stay with you for the time you are told. It is important to have someone help care for you until you are awake and alert.  Take over-the-counter and prescription medicines only as told by your health care provider.  If you have sleep apnea, surgery and certain medicines can increase your risk for breathing problems. Follow instructions from your health care provider about wearing your sleep device: ? Anytime you are sleeping, including during daytime naps. ? While taking prescription pain medicines, sleeping medicines,  or medicines that make you drowsy.  Avoid smoking.  Keep all follow-up visits as told by your health care provider. This is important. Contact a health care provider if:  You keep feeling nauseous or you keep vomiting.  You feel light-headed.  You are still sleepy or having trouble with balance after 24 hours.  You develop a rash.  You have a fever.  You have redness or swelling around the IV site. Get help right away if:  You have trouble breathing.  You have new-onset confusion at home. Summary  For several hours after your procedure, you may feel tired. You may also be forgetful and have poor judgment.  Have a responsible adult stay with you for the time you are told. It is important to have someone help care for you until you are awake and alert.  Rest as told. Do not drive or operate machinery. Do not drink alcohol or take sleeping pills.  Get help right away if you have trouble breathing, or if you suddenly become confused. This information is not intended to replace advice given to you by your health care provider. Make sure you discuss any questions you have with your health care  provider. Document Revised: 06/14/2020 Document Reviewed: 09/01/2019 Elsevier Patient Education  2021 [employer].

## 2020-12-17 ENCOUNTER — Other Ambulatory Visit (HOSPITAL_COMMUNITY): Payer: Medicare PPO

## 2020-12-17 ENCOUNTER — Encounter (HOSPITAL_COMMUNITY): Admission: RE | Admit: 2020-12-17 | Payer: Medicare PPO | Source: Ambulatory Visit

## 2020-12-17 ENCOUNTER — Encounter (HOSPITAL_COMMUNITY): Payer: Self-pay

## 2020-12-17 ENCOUNTER — Encounter (HOSPITAL_COMMUNITY)
Admission: RE | Admit: 2020-12-17 | Discharge: 2020-12-17 | Disposition: A | Discharge status: Home / Self Care | Payer: Medicare PPO | Source: Ambulatory Visit | Attending: Internal Medicine | Admitting: Internal Medicine

## 2020-12-17 NOTE — OR Nursing (Signed)
Patient called to postpone procedure due to family health matter. She will call office this morning to inform them as well.

## 2020-12-19 ENCOUNTER — Ambulatory Visit (HOSPITAL_COMMUNITY): Admission: RE | Admit: 2020-12-19 | Payer: Medicare PPO | Source: Ambulatory Visit | Admitting: Internal Medicine

## 2020-12-19 ENCOUNTER — Encounter (HOSPITAL_COMMUNITY): Admission: RE | Payer: Self-pay | Source: Ambulatory Visit

## 2020-12-19 SURGERY — COLONOSCOPY WITH PROPOFOL
Anesthesia: Monitor Anesthesia Care

## 2021-02-19 ENCOUNTER — Ambulatory Visit (INDEPENDENT_AMBULATORY_CARE_PROVIDER_SITE_OTHER): Payer: Medicare PPO | Admitting: Internal Medicine

## 2021-02-25 DIAGNOSIS — M791 Myalgia, unspecified site: Secondary | ICD-10-CM | POA: Diagnosis not present

## 2021-02-25 DIAGNOSIS — E782 Mixed hyperlipidemia: Secondary | ICD-10-CM | POA: Diagnosis not present

## 2021-02-25 DIAGNOSIS — Z0000 Encounter for general adult medical examination without abnormal findings: Secondary | ICD-10-CM | POA: Diagnosis not present

## 2021-02-25 DIAGNOSIS — R7303 Prediabetes: Secondary | ICD-10-CM | POA: Diagnosis not present

## 2021-05-27 ENCOUNTER — Other Ambulatory Visit (HOSPITAL_COMMUNITY): Payer: Self-pay | Admitting: Adult Health

## 2021-05-27 DIAGNOSIS — Z1231 Encounter for screening mammogram for malignant neoplasm of breast: Secondary | ICD-10-CM

## 2021-06-28 ENCOUNTER — Other Ambulatory Visit: Payer: Self-pay

## 2021-06-28 ENCOUNTER — Ambulatory Visit (HOSPITAL_COMMUNITY)
Admission: RE | Admit: 2021-06-28 | Discharge: 2021-06-28 | Disposition: A | Discharge status: Home / Self Care | Payer: Medicare PPO | Source: Ambulatory Visit | Attending: Adult Health | Admitting: Adult Health

## 2021-06-28 DIAGNOSIS — Z1231 Encounter for screening mammogram for malignant neoplasm of breast: Secondary | ICD-10-CM | POA: Insufficient documentation

## 2021-08-28 DIAGNOSIS — B001 Herpesviral vesicular dermatitis: Secondary | ICD-10-CM | POA: Diagnosis not present

## 2021-08-28 DIAGNOSIS — F331 Major depressive disorder, recurrent, moderate: Secondary | ICD-10-CM | POA: Diagnosis not present

## 2021-08-28 DIAGNOSIS — G4700 Insomnia, unspecified: Secondary | ICD-10-CM | POA: Diagnosis not present

## 2021-08-28 DIAGNOSIS — E782 Mixed hyperlipidemia: Secondary | ICD-10-CM | POA: Diagnosis not present

## 2021-08-28 DIAGNOSIS — M797 Fibromyalgia: Secondary | ICD-10-CM | POA: Diagnosis not present

## 2021-08-28 DIAGNOSIS — R7303 Prediabetes: Secondary | ICD-10-CM | POA: Diagnosis not present

## 2021-08-28 DIAGNOSIS — F411 Generalized anxiety disorder: Secondary | ICD-10-CM | POA: Diagnosis not present

## 2021-12-03 ENCOUNTER — Ambulatory Visit: Payer: Medicare PPO | Admitting: Physician Assistant

## 2021-12-09 ENCOUNTER — Other Ambulatory Visit: Payer: Self-pay

## 2021-12-09 ENCOUNTER — Ambulatory Visit: Payer: Medicare PPO | Admitting: Physician Assistant

## 2021-12-09 ENCOUNTER — Encounter: Payer: Self-pay | Admitting: Physician Assistant

## 2021-12-09 DIAGNOSIS — Z1283 Encounter for screening for malignant neoplasm of skin: Secondary | ICD-10-CM | POA: Diagnosis not present

## 2021-12-09 DIAGNOSIS — L821 Other seborrheic keratosis: Secondary | ICD-10-CM | POA: Diagnosis not present

## 2021-12-09 DIAGNOSIS — D485 Neoplasm of uncertain behavior of skin: Secondary | ICD-10-CM | POA: Diagnosis not present

## 2021-12-09 DIAGNOSIS — L659 Nonscarring hair loss, unspecified: Secondary | ICD-10-CM

## 2021-12-09 DIAGNOSIS — L570 Actinic keratosis: Secondary | ICD-10-CM | POA: Diagnosis not present

## 2021-12-09 NOTE — Progress Notes (Signed)
° °  New Patient   Subjective  Monica Davis is a 61 y.o. female who presents for the following: Annual Exam (Dry lesions on the face that she picks x months , right post shoulder and left shin non healing lesion x months. No personal history of skin cancer or atypia.).Her right, 2nd digit has a "lump" on it that is very painful. She was hauling wood and most likely it was a splinter but it won't go away. Her hair has been falling out recently. She has been under a lot of stress with the death of her brother, and her father is in the hospital.    The following portions of the chart were reviewed this encounter and updated as appropriate:  Tobacco   Allergies   Meds   Problems   Med Hx   Surg Hx   Fam Hx       Objective  Well appearing patient in no apparent distress; mood and affect are within normal limits.  All skin waist up examined.  Right Upper Back No atypical nevi or signs of NMSC noted at the time of the visit.   Left Lower Leg - Anterior Stuck-on, waxy, tan-brown papules and plaques. --Discussed benign etiology and prognosis.   Left Malar Cheek, Right Nasal Sidewall (2) Erythematous patches with gritty scale.  Right Dorsal Mid 2nd Finger Nodule with central opening  Mid Parietal Scalp Diffuse loss   Assessment & Plan  Encounter for screening for malignant neoplasm of skin Right Upper Back  Yearly skin examination  Seborrheic keratosis Left Lower Leg - Anterior  Okay to leave if stable  AK (actinic keratosis) (3) Right Nasal Sidewall (2); Left Malar Cheek  Destruction of lesion - Left Malar Cheek, Right Nasal Sidewall Complexity: simple   Destruction method: cryotherapy   Informed consent: discussed and consent obtained   Timeout:  patient name, date of birth, surgical site, and procedure verified Lesion destroyed using liquid nitrogen: Yes   Cryotherapy cycles:  3 Outcome: patient tolerated procedure well with no complications    Neoplasm of uncertain  behavior of skin Right Dorsal Mid 2nd Finger  Skin / nail biopsy Type of biopsy: punch   Informed consent: discussed and consent obtained   Timeout: patient name, date of birth, surgical site, and procedure verified   Procedure prep:  Patient was prepped and draped in usual sterile fashion (Non sterile) Prep type:  Chlorhexidine Anesthesia: the lesion was anesthetized in a standard fashion   Anesthetic:  1% lidocaine w/ epinephrine 1-100,000 local infiltration Punch size:  2.5 mm Suture removal (days):  0 Hemostasis achieved with: suture   Outcome: patient tolerated procedure well   Additional details:  No sutures  Specimen 1 - Surgical pathology Differential Diagnosis: r/o cyst, foreign object  Check Margins: No  Alopecia Mid Parietal Scalp  5% Rogaine   No atypical nevi noted at the time of the visit.  I, Verma Grothaus, PA-C, have reviewed all documentation's for this visit.  The documentation on 12/09/21 for the exam, diagnosis, procedures and orders are all accurate and complete.

## 2022-05-14 IMAGING — MG DIGITAL SCREENING BILAT W/ TOMO W/ CAD
8 series · 8 of 24 positions shown · non-contrast
Comparison: Previous exam(s).

CLINICAL DATA: Screening.

EXAM:
DIGITAL SCREENING BILATERAL MAMMOGRAM WITH TOMO AND CAD

[R MLO synth-2D]
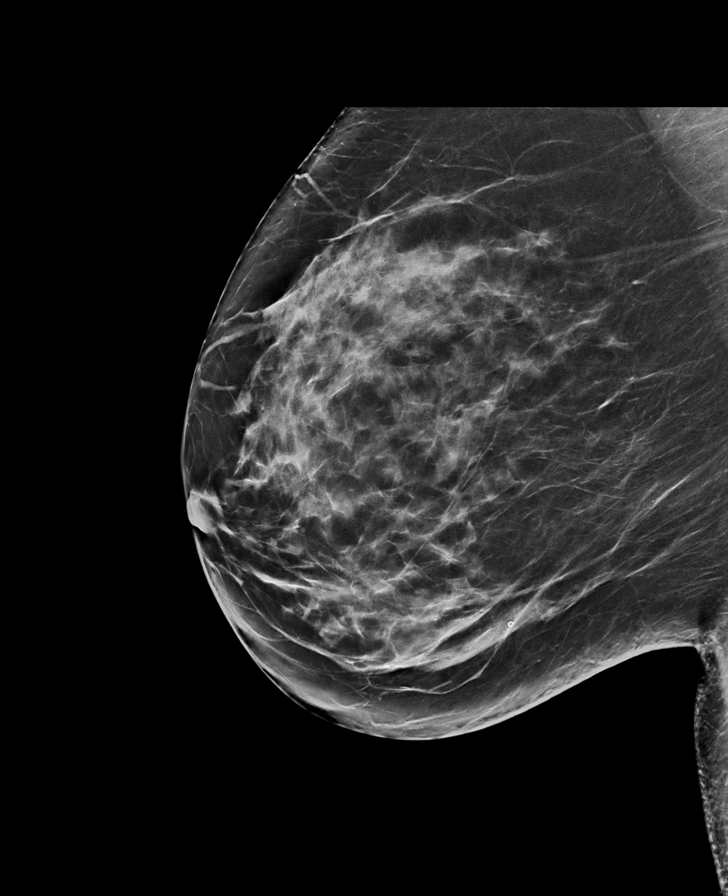

[L MLO synth-2D]
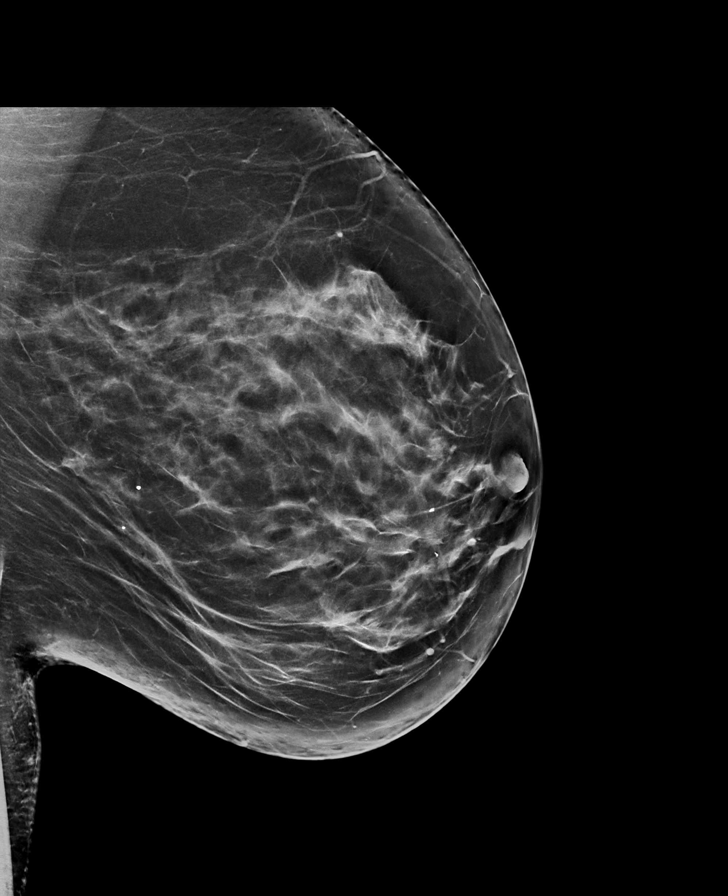

[L CC synth-2D]
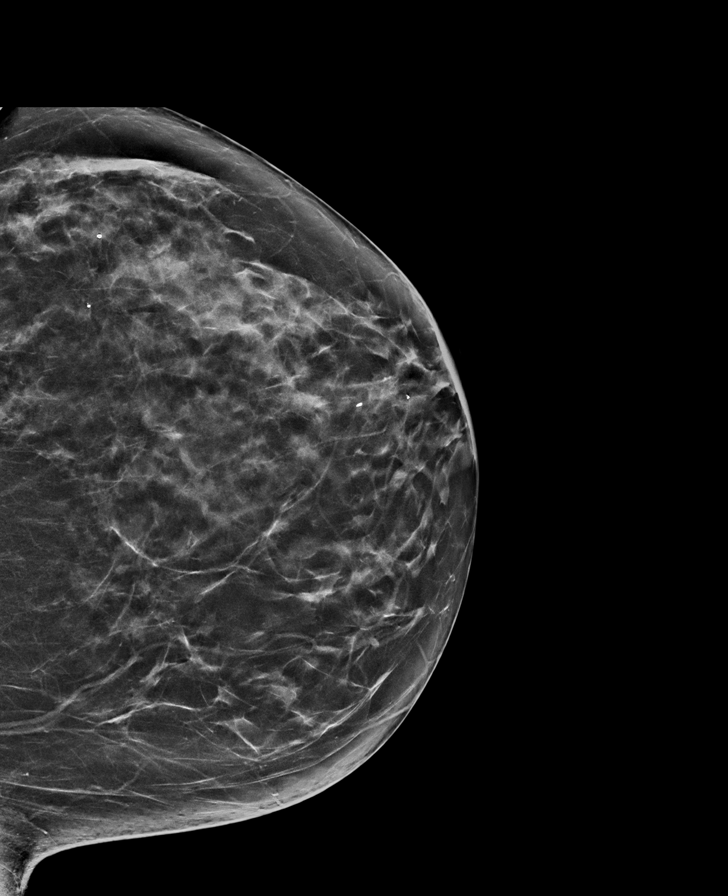

[R CC synth-2D]
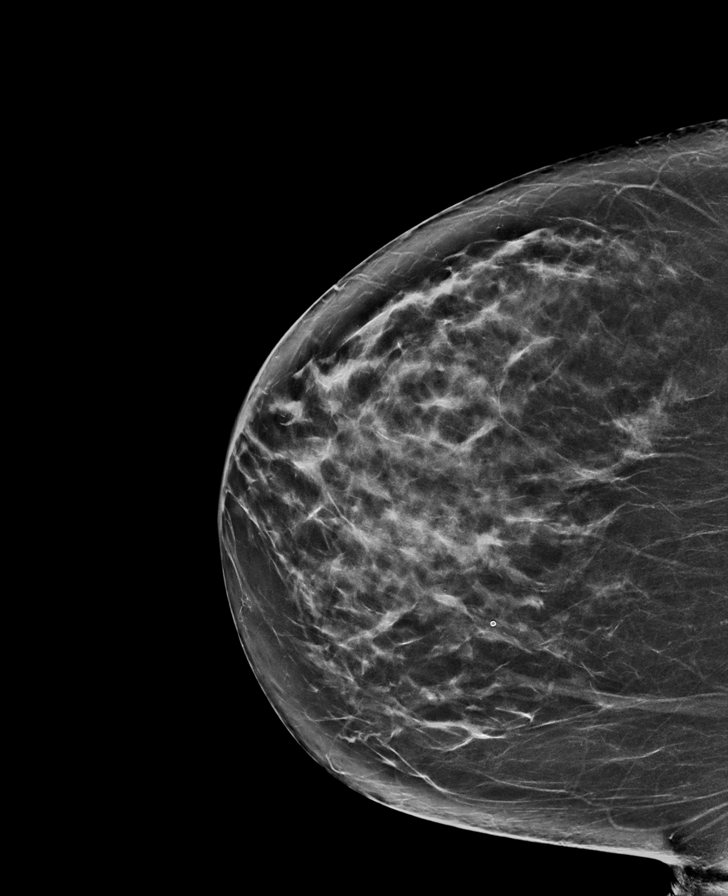

[R CC tomo · tomo slice 40/79.0]
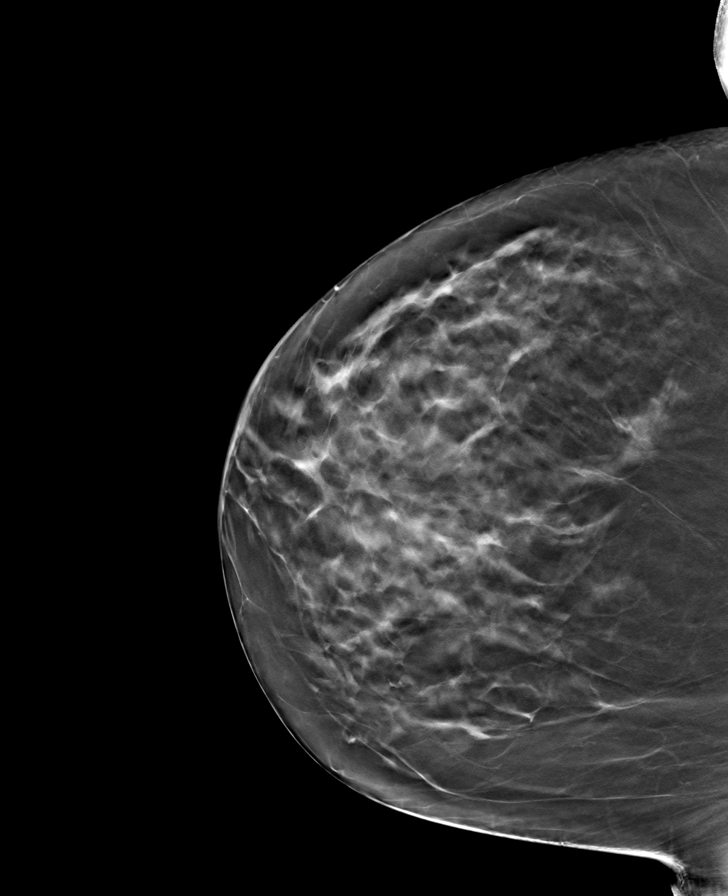

[L MLO tomo · tomo slice 43/85.0]
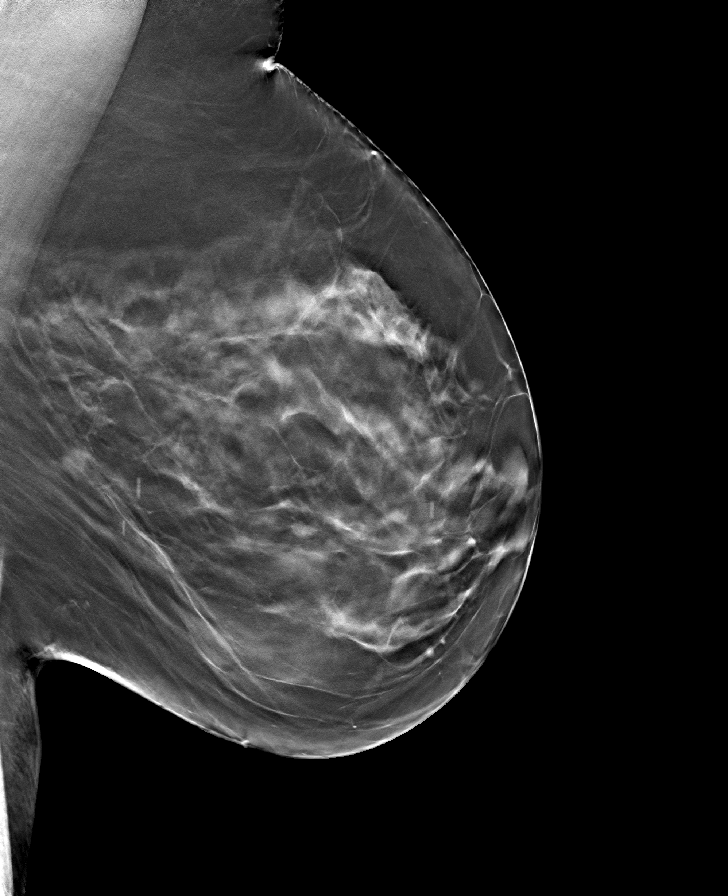

[R MLO tomo · tomo slice 42/83.0]
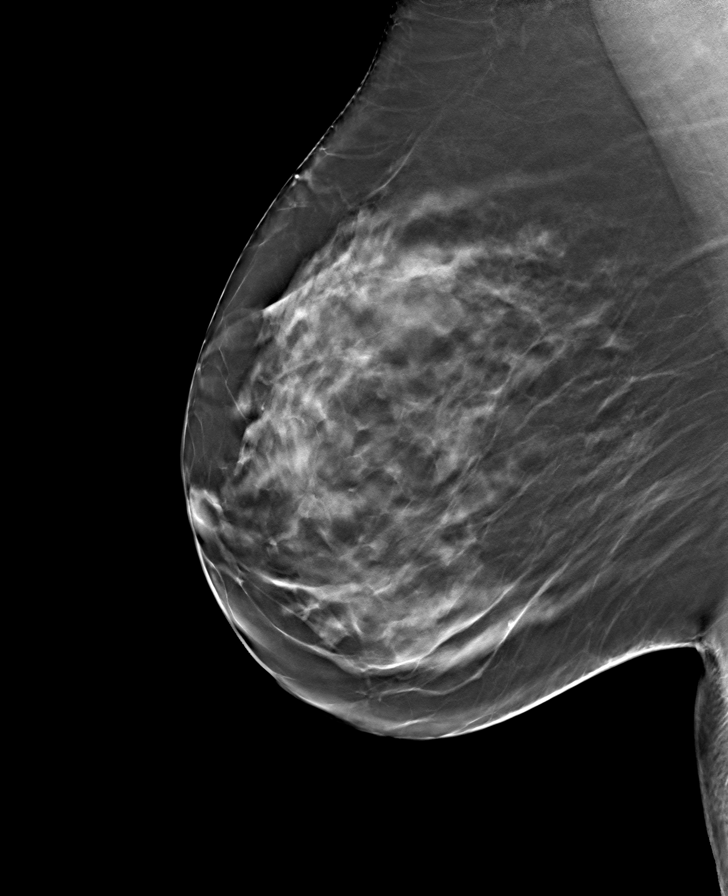

[L CC tomo · tomo slice 35/70.0]
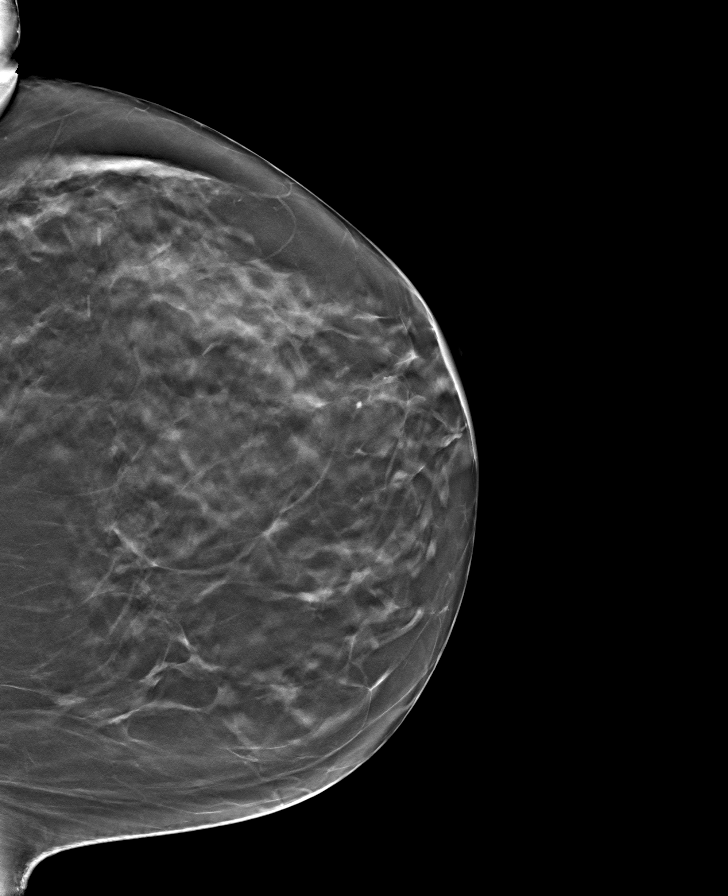

[8 of 24 positions shown; findings below may reference images not displayed]

ACR Breast Density Category c: The breast tissue is heterogeneously
dense, which may obscure small masses.
FINDINGS: There are no findings suspicious for malignancy. Images were
processed with CAD.
IMPRESSION: No mammographic evidence of malignancy. A result letter of this
screening mammogram will be mailed directly to the patient.

RECOMMENDATION:
Screening mammogram in one year. (Code:FT-U-LHB)

BI-RADS CATEGORY  1: Negative.

## 2022-05-15 ENCOUNTER — Other Ambulatory Visit (HOSPITAL_COMMUNITY): Payer: Self-pay | Admitting: Adult Health

## 2022-05-15 DIAGNOSIS — Z1231 Encounter for screening mammogram for malignant neoplasm of breast: Secondary | ICD-10-CM

## 2022-06-30 ENCOUNTER — Ambulatory Visit (HOSPITAL_COMMUNITY)
Admission: RE | Admit: 2022-06-30 | Discharge: 2022-06-30 | Disposition: A | Discharge status: Home / Self Care | Payer: Medicare PPO | Source: Ambulatory Visit | Attending: Adult Health | Admitting: Adult Health

## 2022-06-30 DIAGNOSIS — Z1231 Encounter for screening mammogram for malignant neoplasm of breast: Secondary | ICD-10-CM | POA: Insufficient documentation

## 2022-12-09 ENCOUNTER — Ambulatory Visit: Payer: Medicare PPO | Admitting: Physician Assistant

## 2023-04-08 ENCOUNTER — Ambulatory Visit (HOSPITAL_COMMUNITY)
Admission: RE | Admit: 2023-04-08 | Discharge: 2023-04-08 | Disposition: A | Discharge status: Home / Self Care | Payer: Medicare PPO | Source: Ambulatory Visit | Attending: Otolaryngology | Admitting: Otolaryngology

## 2023-04-08 ENCOUNTER — Other Ambulatory Visit: Payer: Self-pay

## 2023-04-08 DIAGNOSIS — M79645 Pain in left finger(s): Secondary | ICD-10-CM | POA: Insufficient documentation

## 2023-06-23 ENCOUNTER — Other Ambulatory Visit (HOSPITAL_COMMUNITY): Payer: Self-pay | Admitting: Adult Health

## 2023-06-23 DIAGNOSIS — Z1231 Encounter for screening mammogram for malignant neoplasm of breast: Secondary | ICD-10-CM

## 2023-07-03 ENCOUNTER — Ambulatory Visit (HOSPITAL_COMMUNITY)
Admission: RE | Admit: 2023-07-03 | Discharge: 2023-07-03 | Disposition: A | Discharge status: Home / Self Care | Payer: Medicare PPO | Source: Ambulatory Visit | Attending: Adult Health | Admitting: Adult Health

## 2023-07-03 ENCOUNTER — Encounter (HOSPITAL_COMMUNITY): Payer: Self-pay

## 2023-07-03 DIAGNOSIS — Z1231 Encounter for screening mammogram for malignant neoplasm of breast: Secondary | ICD-10-CM | POA: Insufficient documentation

## 2024-05-23 ENCOUNTER — Other Ambulatory Visit (HOSPITAL_COMMUNITY): Payer: Self-pay | Admitting: Adult Health

## 2024-05-23 DIAGNOSIS — Z1231 Encounter for screening mammogram for malignant neoplasm of breast: Secondary | ICD-10-CM

## 2024-07-04 ENCOUNTER — Ambulatory Visit (HOSPITAL_COMMUNITY)

## 2024-07-25 ENCOUNTER — Ambulatory Visit (HOSPITAL_COMMUNITY)
Admission: RE | Admit: 2024-07-25 | Discharge: 2024-07-25 | Disposition: A | Discharge status: Home / Self Care | Source: Ambulatory Visit | Attending: Adult Health | Admitting: Adult Health

## 2024-07-25 DIAGNOSIS — Z1231 Encounter for screening mammogram for malignant neoplasm of breast: Secondary | ICD-10-CM | POA: Insufficient documentation

## 2024-07-27 ENCOUNTER — Ambulatory Visit: Payer: Self-pay | Admitting: Adult Health

## 2024-11-14 ENCOUNTER — Telehealth: Admitting: Family Medicine

## 2024-11-14 DIAGNOSIS — B9689 Other specified bacterial agents as the cause of diseases classified elsewhere: Secondary | ICD-10-CM

## 2024-11-14 MED ORDER — AMOXICILLIN-POT CLAVULANATE 875-125 MG PO TABS: 1.0000 | ORAL_TABLET | Freq: Two times a day (BID) | ORAL | 0 refills | Status: AC

## 2024-11-14 MED ORDER — IPRATROPIUM BROMIDE 0.03 % NA SOLN: 2.0000 | Freq: Two times a day (BID) | NASAL | 0 refills | Status: AC

## 2024-11-14 MED ORDER — BENZONATATE 100 MG PO CAPS: 100.0000 mg | ORAL_CAPSULE | Freq: Three times a day (TID) | ORAL | 0 refills | Status: AC | PRN

## 2024-11-14 NOTE — Addendum Note (Signed)
 Addended by: MOISHE SHAWN HERO on: 11/14/2024 11:40 AM   Modules accepted: Orders

## 2024-11-14 NOTE — Progress Notes (Signed)
 E-Visit for Sinus Problems  We are sorry that you are not feeling well.  Here is how we plan to help!  Based on what you have shared with me it looks like you have sinusitis.  Sinusitis is inflammation and infection in the sinus cavities of the head.  Based on your presentation I believe you most likely have Acute Bacterial Sinusitis.  This is an infection caused by bacteria and is treated with antibiotics. I have prescribed Augmentin 875mg /125mg  one tablet twice daily with food, for 7 days. and I have also prescribed Ipratropium Bromide Nasal Spray Use 1 spray in each nostril twice daily as needed for drainage; discontinue if too drying You may use an oral decongestant such as Mucinex  D or if you have glaucoma or high blood pressure use plain Mucinex . Saline nasal spray help and can safely be used as often as needed for congestion.  If you develop worsening sinus pain, fever or notice severe headache and vision changes, or if symptoms are not better after completion of antibiotic, please schedule an appointment with a health care provider.    Sinus infections are not as easily transmitted as other respiratory infection, however we still recommend that you avoid close contact with loved ones, especially the very young and elderly.  Remember to wash your hands thoroughly throughout the day as this is the number one way to prevent the spread of infection!  Home Care: Only take medications as instructed by your medical team. Complete the entire course of an antibiotic. Do not take these medications with alcohol. A steam or ultrasonic humidifier can help congestion.  You can place a towel over your head and breathe in the steam from hot water coming from a faucet. Avoid close contacts especially the very young and the elderly. Cover your mouth when you cough or sneeze. Always remember to wash your hands.  Get Help Right Away If: You develop worsening fever or sinus pain. You develop a severe head  ache or visual changes. Your symptoms persist after you have completed your treatment plan.  Make sure you Understand these instructions. Will watch your condition. Will get help right away if you are not doing well or get worse.  Your e-visit answers were reviewed by a board certified advanced clinical practitioner to complete your personal care plan.  Depending on the condition, your plan could have included both over the counter or prescription medications.  If there is a problem please reply  once you have received a response from your provider.  Your safety is important to us .  If you have drug allergies check your prescription carefully.    You can use MyChart to ask questions about today's visit, request a non-urgent call back, or ask for a work or school excuse for 24 hours related to this e-Visit. If it has been greater than 24 hours you will need to follow up with your provider, or enter a new e-Visit to address those concerns.  You will get an e-mail in the next two days asking about your experience.  I hope that your e-visit has been valuable and will speed your recovery. Thank you for using e-visits.  I have spent 5 minutes in review of e-visit questionnaire, review and updating patient chart, medical decision making and response to patient.   Chiquita CHRISTELLA Barefoot, NP
